# Patient Record
Sex: Male | Born: 1970 | Race: White | Hispanic: No | Marital: Single | Smoking: Never smoker
Health system: Southern US, Community
[De-identification: ages and names within clinical notes are randomized; demographics above are authoritative.]

## PROBLEM LIST (undated history)

## (undated) DIAGNOSIS — F419 Anxiety disorder, unspecified: Secondary | ICD-10-CM

## (undated) DIAGNOSIS — T1490XA Injury, unspecified, initial encounter: Secondary | ICD-10-CM

## (undated) DIAGNOSIS — F319 Bipolar disorder, unspecified: Secondary | ICD-10-CM

## (undated) DIAGNOSIS — F1010 Alcohol abuse, uncomplicated: Secondary | ICD-10-CM

## (undated) DIAGNOSIS — F1410 Cocaine abuse, uncomplicated: Secondary | ICD-10-CM

## (undated) HISTORY — PX: CRANIECTOMY: SHX331

---

## 1998-11-15 ENCOUNTER — Emergency Department (HOSPITAL_COMMUNITY): Admission: EM | Admit: 1998-11-15 | Discharge: 1998-11-15 | Payer: Self-pay | Admitting: Emergency Medicine

## 1998-11-17 ENCOUNTER — Encounter: Payer: Self-pay | Admitting: Emergency Medicine

## 1998-11-17 ENCOUNTER — Emergency Department (HOSPITAL_COMMUNITY): Admission: EM | Admit: 1998-11-17 | Discharge: 1998-11-17 | Payer: Self-pay | Admitting: Emergency Medicine

## 2001-08-20 ENCOUNTER — Emergency Department (HOSPITAL_COMMUNITY): Admission: EM | Admit: 2001-08-20 | Discharge: 2001-08-20 | Payer: Self-pay | Admitting: Emergency Medicine

## 2007-11-14 ENCOUNTER — Emergency Department (HOSPITAL_COMMUNITY): Admission: EM | Admit: 2007-11-14 | Discharge: 2007-11-15 | Payer: Self-pay | Admitting: Emergency Medicine

## 2008-03-08 ENCOUNTER — Emergency Department (HOSPITAL_COMMUNITY): Admission: EM | Admit: 2008-03-08 | Discharge: 2008-03-08 | Payer: Self-pay | Admitting: Emergency Medicine

## 2009-02-11 ENCOUNTER — Emergency Department (HOSPITAL_COMMUNITY): Admission: EM | Admit: 2009-02-11 | Discharge: 2009-02-11 | Payer: Self-pay | Admitting: Emergency Medicine

## 2009-02-14 ENCOUNTER — Emergency Department (HOSPITAL_COMMUNITY): Admission: EM | Admit: 2009-02-14 | Discharge: 2009-02-14 | Payer: Self-pay | Admitting: Emergency Medicine

## 2009-03-19 ENCOUNTER — Emergency Department (HOSPITAL_COMMUNITY): Admission: EM | Admit: 2009-03-19 | Discharge: 2009-03-20 | Payer: Self-pay | Admitting: Emergency Medicine

## 2009-08-26 ENCOUNTER — Emergency Department (HOSPITAL_COMMUNITY): Admission: EM | Admit: 2009-08-26 | Discharge: 2009-08-26 | Payer: Self-pay | Admitting: Emergency Medicine

## 2009-10-02 ENCOUNTER — Emergency Department: Payer: Self-pay | Admitting: Emergency Medicine

## 2010-05-24 ENCOUNTER — Emergency Department (HOSPITAL_COMMUNITY): Admission: EM | Admit: 2010-05-24 | Discharge: 2010-05-24 | Payer: Self-pay | Admitting: Emergency Medicine

## 2010-06-16 ENCOUNTER — Emergency Department (HOSPITAL_COMMUNITY): Admission: EM | Admit: 2010-06-16 | Discharge: 2010-06-16 | Payer: Self-pay | Admitting: Emergency Medicine

## 2010-07-23 ENCOUNTER — Emergency Department (HOSPITAL_COMMUNITY): Admission: EM | Admit: 2010-07-23 | Discharge: 2010-07-23 | Payer: Self-pay | Admitting: Emergency Medicine

## 2010-08-30 ENCOUNTER — Emergency Department (HOSPITAL_COMMUNITY): Admission: EM | Admit: 2010-08-30 | Discharge: 2010-08-31 | Payer: Self-pay | Admitting: Emergency Medicine

## 2010-09-27 ENCOUNTER — Emergency Department (HOSPITAL_COMMUNITY)
Admission: EM | Admit: 2010-09-27 | Discharge: 2010-09-27 | Payer: Self-pay | Source: Home / Self Care | Admitting: Emergency Medicine

## 2010-10-10 ENCOUNTER — Emergency Department (HOSPITAL_COMMUNITY)
Admission: EM | Admit: 2010-10-10 | Discharge: 2010-10-10 | Disposition: A | Discharge status: Other Acute Inpatient Hospital | Payer: Self-pay | Source: Home / Self Care | Admitting: Emergency Medicine

## 2010-11-18 ENCOUNTER — Emergency Department (HOSPITAL_COMMUNITY)
Admission: EM | Admit: 2010-11-18 | Discharge: 2010-11-18 | Payer: Self-pay | Source: Home / Self Care | Admitting: Emergency Medicine

## 2010-11-18 LAB — COMPREHENSIVE METABOLIC PANEL
Alkaline Phosphatase: 91 U/L (ref 39–117)
BUN: 9 mg/dL (ref 6–23)
CO2: 27 mEq/L (ref 19–32)
Calcium: 9 mg/dL (ref 8.4–10.5)
GFR calc non Af Amer: >60 mL/min (ref >60)
Glucose, Bld: 89 mg/dL (ref 70–99)
Potassium: 3.7 mEq/L (ref 3.5–5.1)
Total Protein: 7.3 g/dL (ref 6.0–8.3)

## 2010-11-18 LAB — RAPID URINE DRUG SCREEN, HOSP PERFORMED
Amphetamines: NOT DETECTED
Barbiturates: NOT DETECTED
Benzodiazepines: POSITIVE — AB
Opiates: NOT DETECTED

## 2010-11-18 LAB — DIFFERENTIAL
Basophils Absolute: 0 10*3/uL (ref 0.0–0.1)
Basophils Relative: 0 % (ref 0–1)
Neutro Abs: 3.8 10*3/uL (ref 1.7–7.7)
Neutrophils Relative %: 55 % (ref 43–77)

## 2010-11-18 LAB — CBC
Hemoglobin: 15.5 g/dL (ref 13.0–17.0)
RBC: 4.77 MIL/uL (ref 4.22–5.81)

## 2010-11-18 LAB — ETHANOL: Alcohol, Ethyl (B): <5 mg/dL (ref 0–10)

## 2011-01-01 LAB — CBC
HCT: 45 % (ref 39.0–52.0)
Hemoglobin: 16.4 g/dL (ref 13.0–17.0)
MCV: 92.8 fL (ref 78.0–100.0)
RDW: 14 % (ref 11.5–15.5)
WBC: 6.4 10*3/uL (ref 4.0–10.5)

## 2011-01-01 LAB — COMPREHENSIVE METABOLIC PANEL
ALT: 42 U/L (ref 0–53)
Alkaline Phosphatase: 80 U/L (ref 39–117)
BUN: 8 mg/dL (ref 6–23)
CO2: 22 mEq/L (ref 19–32)
Chloride: 103 mEq/L (ref 96–112)
Glucose, Bld: 117 mg/dL — ABNORMAL HIGH (ref 70–99)
Potassium: 3.5 mEq/L (ref 3.5–5.1)
Sodium: 138 mEq/L (ref 135–145)
Total Bilirubin: 0.9 mg/dL (ref 0.3–1.2)
Total Protein: 7.6 g/dL (ref 6.0–8.3)

## 2011-01-01 LAB — DIFFERENTIAL
Eosinophils Relative: 1 % (ref 0–5)
Lymphocytes Relative: 28 % (ref 12–46)
Lymphs Abs: 1.8 10*3/uL (ref 0.7–4.0)
Monocytes Absolute: 0.6 10*3/uL (ref 0.1–1.0)
Monocytes Relative: 9 % (ref 3–12)

## 2011-01-01 LAB — POCT CARDIAC MARKERS
CKMB, poc: <1 ng/mL — ABNORMAL LOW (ref 1.0–8.0)
Troponin i, poc: <0.05 ng/mL (ref 0.00–0.09)
Troponin i, poc: <0.05 ng/mL (ref 0.00–0.09)

## 2011-01-01 LAB — RAPID URINE DRUG SCREEN, HOSP PERFORMED
Barbiturates: NOT DETECTED
Benzodiazepines: POSITIVE — AB

## 2011-01-02 LAB — BASIC METABOLIC PANEL
BUN: 7 mg/dL (ref 6–23)
CO2: 27 mEq/L (ref 19–32)
Chloride: 95 mEq/L — ABNORMAL LOW (ref 96–112)
GFR calc Af Amer: >60 mL/min (ref >60)
GFR calc non Af Amer: >60 mL/min (ref >60)
Glucose, Bld: 100 mg/dL — ABNORMAL HIGH (ref 70–99)
Glucose, Bld: 109 mg/dL — ABNORMAL HIGH (ref 70–99)
Potassium: 3.8 mEq/L (ref 3.5–5.1)
Sodium: 140 mEq/L (ref 135–145)

## 2011-01-02 LAB — DIFFERENTIAL
Basophils Absolute: 0 10*3/uL (ref 0.0–0.1)
Basophils Relative: 1 % (ref 0–1)
Eosinophils Absolute: 0 10*3/uL (ref 0.0–0.7)
Eosinophils Absolute: 0.1 10*3/uL (ref 0.0–0.7)
Eosinophils Relative: 1 % (ref 0–5)
Eosinophils Relative: 1 % (ref 0–5)
Lymphs Abs: 2.3 10*3/uL (ref 0.7–4.0)
Monocytes Absolute: 0.8 10*3/uL (ref 0.1–1.0)
Monocytes Absolute: 0.9 10*3/uL (ref 0.1–1.0)
Monocytes Relative: 8 % (ref 3–12)
Neutro Abs: 3.5 10*3/uL (ref 1.7–7.7)

## 2011-01-02 LAB — RAPID URINE DRUG SCREEN, HOSP PERFORMED
Barbiturates: NOT DETECTED
Barbiturates: NOT DETECTED
Benzodiazepines: POSITIVE — AB
Cocaine: POSITIVE — AB
Opiates: NOT DETECTED
Opiates: NOT DETECTED

## 2011-01-02 LAB — HEPATIC FUNCTION PANEL
AST: 30 U/L (ref 0–37)
Bilirubin, Direct: 0.1 mg/dL (ref 0.0–0.3)
Indirect Bilirubin: 0.6 mg/dL (ref 0.3–0.9)

## 2011-01-02 LAB — CBC
HCT: 45.7 % (ref 39.0–52.0)
Hemoglobin: 17.1 g/dL — ABNORMAL HIGH (ref 13.0–17.0)
MCH: 32.5 pg (ref 26.0–34.0)
MCHC: 37.2 g/dL — ABNORMAL HIGH (ref 30.0–36.0)
MCV: 95.6 fL (ref 78.0–100.0)
RBC: 5.26 MIL/uL (ref 4.22–5.81)
RDW: 14 % (ref 11.5–15.5)

## 2011-01-02 LAB — POCT CARDIAC MARKERS: Troponin i, poc: <0.05 ng/mL (ref 0.00–0.09)

## 2011-01-24 LAB — POCT CARDIAC MARKERS
CKMB, poc: <1 ng/mL — ABNORMAL LOW (ref 1.0–8.0)
Troponin i, poc: <0.05 ng/mL (ref 0.00–0.09)

## 2011-01-24 LAB — DIFFERENTIAL
Basophils Absolute: 0 10*3/uL (ref 0.0–0.1)
Lymphocytes Relative: 29 % (ref 12–46)
Monocytes Absolute: 0.9 10*3/uL (ref 0.1–1.0)
Neutro Abs: 4.6 10*3/uL (ref 1.7–7.7)

## 2011-01-24 LAB — COMPREHENSIVE METABOLIC PANEL
Albumin: 4.3 g/dL (ref 3.5–5.2)
BUN: 13 mg/dL (ref 6–23)
Chloride: 102 mEq/L (ref 96–112)
Creatinine, Ser: 1.02 mg/dL (ref 0.4–1.5)
Total Bilirubin: 0.9 mg/dL (ref 0.3–1.2)
Total Protein: 7.9 g/dL (ref 6.0–8.3)

## 2011-01-24 LAB — CBC
HCT: 46.8 % (ref 39.0–52.0)
MCHC: 34.4 g/dL (ref 30.0–36.0)
MCV: 94.1 fL (ref 78.0–100.0)
Platelets: 257 10*3/uL (ref 150–400)
RDW: 13.6 % (ref 11.5–15.5)

## 2011-01-30 LAB — CBC
MCHC: 34.4 g/dL (ref 30.0–36.0)
MCV: 95.8 fL (ref 78.0–100.0)
Platelets: 299 10*3/uL (ref 150–400)
RDW: 14.6 % (ref 11.5–15.5)
WBC: 9.1 10*3/uL (ref 4.0–10.5)

## 2011-01-30 LAB — RAPID URINE DRUG SCREEN, HOSP PERFORMED
Amphetamines: NOT DETECTED
Barbiturates: NOT DETECTED
Cocaine: NOT DETECTED
Opiates: NOT DETECTED
Tetrahydrocannabinol: NOT DETECTED

## 2011-01-30 LAB — DIFFERENTIAL
Eosinophils Relative: 1 % (ref 0–5)
Lymphocytes Relative: 32 % (ref 12–46)
Lymphs Abs: 2.9 10*3/uL (ref 0.7–4.0)
Monocytes Absolute: 0.8 10*3/uL (ref 0.1–1.0)

## 2011-01-30 LAB — COMPREHENSIVE METABOLIC PANEL
AST: 25 U/L (ref 0–37)
Albumin: 4.4 g/dL (ref 3.5–5.2)
Calcium: 9.3 mg/dL (ref 8.4–10.5)
Creatinine, Ser: 1.04 mg/dL (ref 0.4–1.5)
GFR calc Af Amer: >60 mL/min (ref >60)
Total Protein: 7.7 g/dL (ref 6.0–8.3)

## 2011-01-30 LAB — URINALYSIS, ROUTINE W REFLEX MICROSCOPIC
Bilirubin Urine: NEGATIVE
Hgb urine dipstick: NEGATIVE
Nitrite: NEGATIVE
Specific Gravity, Urine: 1.012 (ref 1.005–1.030)
pH: 5.5 (ref 5.0–8.0)

## 2011-01-31 LAB — CBC
Hemoglobin: 15.6 g/dL (ref 13.0–17.0)
MCHC: 34.6 g/dL (ref 30.0–36.0)
MCV: 93.5 fL (ref 78.0–100.0)
RDW: 14.8 % (ref 11.5–15.5)

## 2011-01-31 LAB — COMPREHENSIVE METABOLIC PANEL
ALT: 40 U/L (ref 0–53)
Calcium: 8.6 mg/dL (ref 8.4–10.5)
Creatinine, Ser: 1.1 mg/dL (ref 0.4–1.5)
GFR calc non Af Amer: >60 mL/min (ref >60)
Glucose, Bld: 118 mg/dL — ABNORMAL HIGH (ref 70–99)
Sodium: 138 mEq/L (ref 135–145)
Total Protein: 7.2 g/dL (ref 6.0–8.3)

## 2011-01-31 LAB — URINALYSIS, ROUTINE W REFLEX MICROSCOPIC
Glucose, UA: NEGATIVE mg/dL
Leukocytes, UA: NEGATIVE
Specific Gravity, Urine: <1.005 — ABNORMAL LOW (ref 1.005–1.030)
pH: 5 (ref 5.0–8.0)

## 2011-01-31 LAB — DIFFERENTIAL
Lymphocytes Relative: 22 % (ref 12–46)
Lymphs Abs: 2.2 10*3/uL (ref 0.7–4.0)
Monocytes Relative: 7 % (ref 3–12)
Neutro Abs: 7.1 10*3/uL (ref 1.7–7.7)
Neutrophils Relative %: 71 % (ref 43–77)

## 2011-01-31 LAB — URINE MICROSCOPIC-ADD ON

## 2011-02-22 ENCOUNTER — Emergency Department (HOSPITAL_COMMUNITY)
Admission: EM | Admit: 2011-02-22 | Discharge: 2011-02-22 | Disposition: A | Discharge status: Home / Self Care | Payer: Medicaid Other | Attending: Emergency Medicine | Admitting: Emergency Medicine

## 2011-02-22 DIAGNOSIS — W3189XA Contact with other specified machinery, initial encounter: Secondary | ICD-10-CM | POA: Insufficient documentation

## 2011-02-22 DIAGNOSIS — Z79899 Other long term (current) drug therapy: Secondary | ICD-10-CM | POA: Insufficient documentation

## 2011-02-22 DIAGNOSIS — F17200 Nicotine dependence, unspecified, uncomplicated: Secondary | ICD-10-CM | POA: Insufficient documentation

## 2011-02-22 DIAGNOSIS — F1020 Alcohol dependence, uncomplicated: Secondary | ICD-10-CM | POA: Insufficient documentation

## 2011-02-22 DIAGNOSIS — S51809A Unspecified open wound of unspecified forearm, initial encounter: Secondary | ICD-10-CM | POA: Insufficient documentation

## 2011-02-22 DIAGNOSIS — F319 Bipolar disorder, unspecified: Secondary | ICD-10-CM | POA: Insufficient documentation

## 2011-04-14 ENCOUNTER — Emergency Department (HOSPITAL_COMMUNITY): Payer: Medicaid Other

## 2011-04-14 ENCOUNTER — Emergency Department (HOSPITAL_COMMUNITY)
Admission: EM | Admit: 2011-04-14 | Discharge: 2011-04-14 | Disposition: A | Discharge status: Home / Self Care | Payer: Medicaid Other | Attending: Emergency Medicine | Admitting: Emergency Medicine

## 2011-04-14 DIAGNOSIS — R413 Other amnesia: Secondary | ICD-10-CM | POA: Insufficient documentation

## 2011-04-14 DIAGNOSIS — F17200 Nicotine dependence, unspecified, uncomplicated: Secondary | ICD-10-CM | POA: Insufficient documentation

## 2011-04-14 DIAGNOSIS — F1020 Alcohol dependence, uncomplicated: Secondary | ICD-10-CM | POA: Insufficient documentation

## 2011-04-14 DIAGNOSIS — F1911 Other psychoactive substance abuse, in remission: Secondary | ICD-10-CM | POA: Insufficient documentation

## 2011-04-14 DIAGNOSIS — R209 Unspecified disturbances of skin sensation: Secondary | ICD-10-CM | POA: Insufficient documentation

## 2011-04-14 DIAGNOSIS — F319 Bipolar disorder, unspecified: Secondary | ICD-10-CM | POA: Insufficient documentation

## 2011-04-14 DIAGNOSIS — F29 Unspecified psychosis not due to a substance or known physiological condition: Secondary | ICD-10-CM | POA: Insufficient documentation

## 2011-04-14 DIAGNOSIS — Z87820 Personal history of traumatic brain injury: Secondary | ICD-10-CM | POA: Insufficient documentation

## 2011-04-14 DIAGNOSIS — Z91199 Patient's noncompliance with other medical treatment and regimen due to unspecified reason: Secondary | ICD-10-CM | POA: Insufficient documentation

## 2011-04-14 DIAGNOSIS — Z9119 Patient's noncompliance with other medical treatment and regimen: Secondary | ICD-10-CM | POA: Insufficient documentation

## 2011-04-14 LAB — CBC
Hemoglobin: 17.5 g/dL — ABNORMAL HIGH (ref 13.0–17.0)
MCHC: 35.1 g/dL (ref 30.0–36.0)
Platelets: 216 10*3/uL (ref 150–400)

## 2011-04-14 LAB — RAPID URINE DRUG SCREEN, HOSP PERFORMED
Amphetamines: NOT DETECTED
Opiates: NOT DETECTED
Tetrahydrocannabinol: NOT DETECTED

## 2011-04-14 LAB — COMPREHENSIVE METABOLIC PANEL
ALT: 32 U/L (ref 0–53)
AST: 22 U/L (ref 0–37)
Albumin: 4.6 g/dL (ref 3.5–5.2)
Alkaline Phosphatase: 84 U/L (ref 39–117)
GFR calc Af Amer: >60 mL/min (ref >60)
Glucose, Bld: 106 mg/dL — ABNORMAL HIGH (ref 70–99)
Potassium: 4.4 mEq/L (ref 3.5–5.1)
Sodium: 137 mEq/L (ref 135–145)
Total Protein: 8.4 g/dL — ABNORMAL HIGH (ref 6.0–8.3)

## 2011-04-14 LAB — DIFFERENTIAL
Basophils Absolute: 0 10*3/uL (ref 0.0–0.1)
Basophils Relative: 0 % (ref 0–1)
Monocytes Absolute: 1.1 10*3/uL — ABNORMAL HIGH (ref 0.1–1.0)
Neutro Abs: 7.4 10*3/uL (ref 1.7–7.7)
Neutrophils Relative %: 71 % (ref 43–77)

## 2011-04-14 LAB — URINALYSIS, ROUTINE W REFLEX MICROSCOPIC
Glucose, UA: NEGATIVE mg/dL
Hgb urine dipstick: NEGATIVE
Ketones, ur: NEGATIVE mg/dL
Protein, ur: NEGATIVE mg/dL

## 2011-05-09 ENCOUNTER — Emergency Department (HOSPITAL_COMMUNITY)
Admission: EM | Admit: 2011-05-09 | Discharge: 2011-05-09 | Disposition: A | Discharge status: Home / Self Care | Payer: Medicaid Other | Attending: Emergency Medicine | Admitting: Emergency Medicine

## 2011-05-09 ENCOUNTER — Encounter: Payer: Self-pay | Admitting: *Deleted

## 2011-05-09 DIAGNOSIS — IMO0002 Reserved for concepts with insufficient information to code with codable children: Secondary | ICD-10-CM | POA: Insufficient documentation

## 2011-05-09 DIAGNOSIS — L0291 Cutaneous abscess, unspecified: Secondary | ICD-10-CM

## 2011-05-09 HISTORY — DX: Anxiety disorder, unspecified: F41.9

## 2011-05-09 HISTORY — DX: Bipolar disorder, unspecified: F31.9

## 2011-05-09 MED ORDER — OXYCODONE-ACETAMINOPHEN 10-325 MG PO TABS: 1.0000 | ORAL_TABLET | Freq: Four times a day (QID) | ORAL | Status: AC | PRN

## 2011-05-09 MED ORDER — DOXYCYCLINE HYCLATE 100 MG PO TABS
100.0000 mg | ORAL_TABLET | Freq: Once | ORAL | Status: AC
  Administered 2011-05-09: 100 mg via ORAL
  Filled 2011-05-09: qty 1

## 2011-05-09 MED ORDER — DOXYCYCLINE HYCLATE 100 MG PO CAPS: 100.0000 mg | ORAL_CAPSULE | Freq: Two times a day (BID) | ORAL | Status: AC

## 2011-05-09 MED ORDER — LIDOCAINE HCL (PF) 1 % IJ SOLN
INTRAMUSCULAR | Status: AC
  Administered 2011-05-09: 5 mL
  Filled 2011-05-09: qty 5

## 2011-05-09 NOTE — ED Notes (Signed)
Incision present to rt fa from I&D of abscess,.

## 2011-05-09 NOTE — ED Notes (Signed)
Pt agreed to stay to be seen.

## 2011-05-09 NOTE — ED Notes (Signed)
?  sbcess to right arm, swelling noted, pt did have drainage this am when he attempted to "mash it", started about 4 days ago

## 2011-05-09 NOTE — ED Notes (Signed)
Pt has abscess to rt fa. Pt attempted to "mash" pus out this am. Area red and swollen. Pt states he can not stay long because [place] nad noted.

## 2011-05-09 NOTE — ED Provider Notes (Signed)
History     Chief Complaint  Patient presents with  . Wound Infection   Patient is a 40 y.o. male presenting with abscess. The history is provided by the patient.  Abscess  This is a new problem. The current episode started less than one week ago. The onset was gradual. The problem occurs continuously. The problem has been unchanged. The abscess is present on the right arm. The problem is mild. The abscess is characterized by redness, painfulness and swelling. It is unknown what he was exposed to. The abscess first occurred at home. Pertinent negatives include no fever and no vomiting. There were no sick contacts. He has received no recent medical care.    Past Medical History  Diagnosis Date  . Bipolar affective disorder   . Anxiety     Past Surgical History  Procedure Date  . 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 No pertinent family history.  History  Substance Use Topics  . Smoking status: Current Everyday Smoker    Types: Cigars  . Smokeless tobacco: Not on file  . Alcohol Use: No      Review of Systems  Constitutional: Negative for fever and chills.  HENT: Negative for neck pain and neck stiffness.   Respiratory: Negative for chest tightness.   Cardiovascular: Negative for chest pain.  Gastrointestinal: Negative for vomiting.  Musculoskeletal: Negative.   Skin: Positive for wound.  Neurological: Negative for dizziness, weakness, numbness and headaches.  Hematological: Does not bruise/bleed easily.    Physical Exam  BP 141/101  Pulse 77  Temp(Src) 98.4 F (36.9 C) (Oral)  Resp 20  Ht 5\' 11"  (1.803 m)  Wt 267 lb (121.11 kg)  BMI 37.24 kg/m2  SpO2 97%  Physical Exam  Nursing note and vitals reviewed. Constitutional: He appears well-developed and well-nourished. No distress.  HENT:  Head: Normocephalic and atraumatic.  Neck: Normal range of motion. Neck supple.  Cardiovascular: Normal rate, regular rhythm  and normal heart sounds.   Pulmonary/Chest: Effort normal and breath sounds normal. He exhibits no tenderness.  Musculoskeletal: He exhibits no edema and no tenderness.  Lymphadenopathy:    He has no cervical adenopathy.  Neurological: He is alert. He exhibits normal muscle tone. Coordination normal.  Skin: Skin is warm and dry.             ED Course  INCISION AND DRAINAGE Date/Time: 05/09/2011 2:10 PM Performed by: Trisha Mangle, Satoya Feeley L. Authorized by: Benny Lennert Consent: Verbal consent obtained. Written consent not obtained. Risks and benefits: risks, benefits and alternatives were discussed Consent given by: patient Patient understanding: patient states understanding of the procedure being performed Patient consent: the patient's understanding of the procedure matches consent given Procedure consent: procedure consent matches procedure scheduled Site marked: the operative site was marked Patient identity confirmed: verbally with patient Time out: Immediately prior to procedure a "time out" was called to verify the correct patient, procedure, equipment, support staff and site/side marked as required. Type: abscess Body area: upper extremity Location details: right arm Anesthesia: local infiltration Local anesthetic: lidocaine 1% without epinephrine Anesthetic total: 3 ml Patient sedated: no Risk factor: underlying major vessel and coagulopathy Scalpel size: 11 Needle gauge: 22 Incision type: single straight Complexity: complex Drainage: bloody Drainage amount: scant Wound treatment: wound left open and drain placed Packing material: 1/4 in iodoform gauze Patient tolerance: Patient tolerated the procedure well with no immediate complications.  MDM  Patient agrees to return to ED in 2 days for recheck and packing removal.  PAtient is alet, NAD.  Pt does not appear toxic.  Radial pulse is intact.  CR<2 sec.        Beautiful Pensyl L. Trisha Mangle, [place] 05/19/11 1758

## 2011-05-11 ENCOUNTER — Emergency Department (HOSPITAL_COMMUNITY)
Admission: EM | Admit: 2011-05-11 | Discharge: 2011-05-11 | Disposition: A | Discharge status: Home / Self Care | Payer: Medicaid Other | Attending: Emergency Medicine | Admitting: Emergency Medicine

## 2011-05-11 ENCOUNTER — Encounter (HOSPITAL_COMMUNITY): Payer: Self-pay

## 2011-05-11 DIAGNOSIS — F17200 Nicotine dependence, unspecified, uncomplicated: Secondary | ICD-10-CM | POA: Insufficient documentation

## 2011-05-11 DIAGNOSIS — Z4800 Encounter for change or removal of nonsurgical wound dressing: Secondary | ICD-10-CM | POA: Insufficient documentation

## 2011-05-11 DIAGNOSIS — F411 Generalized anxiety disorder: Secondary | ICD-10-CM | POA: Insufficient documentation

## 2011-05-11 DIAGNOSIS — IMO0002 Reserved for concepts with insufficient information to code with codable children: Secondary | ICD-10-CM | POA: Insufficient documentation

## 2011-05-11 DIAGNOSIS — F319 Bipolar disorder, unspecified: Secondary | ICD-10-CM | POA: Insufficient documentation

## 2011-05-11 DIAGNOSIS — L089 Local infection of the skin and subcutaneous tissue, unspecified: Secondary | ICD-10-CM

## 2011-05-11 MED ORDER — OXYCODONE-ACETAMINOPHEN 5-325 MG PO TABS: 1.0000 | ORAL_TABLET | Freq: Four times a day (QID) | ORAL | Status: AC | PRN

## 2011-05-11 NOTE — ED Notes (Signed)
Pt presents for packing removal to right forearm. Pt states packing was placed 05/09/2011.

## 2011-05-11 NOTE — ED Notes (Signed)
Pt is here to have packing removed from rt forearm.  nad noted

## 2011-05-11 NOTE — ED Provider Notes (Signed)
History     Chief Complaint  Patient presents with  . Wound Check   Patient is a 40 y.o. male presenting with wound check. The history is provided by the patient. No language interpreter was used.  Wound Check  Treated in ED: 2 days ago. Previous treatment in the ED includes I&D of abscess. Treatments since wound repair include oral antibiotics and regular soap and water washings. Fever duration: No fever. The redness has improved. The swelling has improved. The pain has improved. He has no difficulty moving the affected extremity or digit.  Patient reports he had an abscess drained 2 days ago in ED and was advised to return in 2 days to have packing removed. Patient was placed on doxycycline to treat abscess. Patient reports abscess and surrounding redness, erythema and associated pain have improved significantly since treatment provided 2 days ago. Denies fever, chills, n/v.   Patient seen at 10:40 AM  Past Medical History  Diagnosis Date  . Bipolar affective disorder   . Anxiety     Past Surgical History  Procedure Date  . 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 No pertinent family history.  History  Substance Use Topics  . Smoking status: Current Everyday Smoker    Types: Cigars  . Smokeless tobacco: Not on file  . Alcohol Use: No      Review of Systems  Constitutional: Negative for fever, chills and fatigue.  HENT: Negative for congestion, sinus pressure and ear discharge.   Eyes: Negative for discharge.  Respiratory: Negative for cough.   Cardiovascular: Negative for chest pain.  Gastrointestinal: Negative for abdominal pain and diarrhea.  Genitourinary: Negative for frequency and hematuria.  Musculoskeletal: Negative for back pain.  Skin: Positive for wound. Negative for rash.  Neurological: Negative for seizures and headaches.  Hematological: Negative.   Psychiatric/Behavioral: Negative for hallucinations.     Physical Exam  BP 130/62  Pulse 75  Temp(Src) 97.5 F (36.4 C) (Oral)  Resp 14  Ht 5\' 11"  (1.803 m)  Wt 268 lb (121.564 kg)  BMI 37.38 kg/m2  SpO2 98%  Physical Exam  Nursing note and vitals reviewed. Constitutional: He is oriented to person, place, and time. He appears well-developed. No distress.  HENT:  Head: Normocephalic.  Eyes: Conjunctivae are normal. No scleral icterus.  Neck: Normal range of motion. Neck supple.  Cardiovascular: Normal rate.   Pulmonary/Chest: Effort normal.  Musculoskeletal: Normal range of motion.  Neurological: He is oriented to person, place, and time.  Skin: Skin is warm and dry.       Healing wound from abscess incision and drainage to right forearm. Packing removed from wound.   Psychiatric: He has a normal mood and affect. His behavior is normal.    ED Course  Procedures I personally performed the services described in this documentation, which was scribed in my presence. The recorded information has been reviewed and considered.  MDM   Chart written by Clarita Crane acting as scribe for Benny Lennert, MD  I personally performed the services described in this documentation, which was scribed in my presence. The recorded information has been reviewed and considered.     Benny Lennert, MD 05/11/11 1051

## 2011-05-18 NOTE — ED Provider Notes (Signed)
History     Chief Complaint  Patient presents with  . Wound Infection   HPI  Past Medical History  Diagnosis Date  . Bipolar affective disorder   . Anxiety     Past Surgical History  Procedure Date  . 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 No pertinent family history.  History  Substance Use Topics  . Smoking status: Current Everyday Smoker    Types: Cigars  . Smokeless tobacco: Not on file  . Alcohol Use: No      Review of Systems  Physical Exam  BP 141/101  Pulse 77  Temp(Src) 98.4 F (36.9 C) (Oral)  Resp 20  Ht 5\' 11"  (1.803 m)  Wt 267 lb (121.11 kg)  BMI 37.24 kg/m2  SpO2 97%  Physical Exam  ED Course  Procedures  MDM Medical screening examination/treatment/procedure(s) were performed by non-physician practitioner and as supervising physician I was immediately available for consultation/collaboration.       Benny Lennert, MD 06/08/11 [phone]

## 2011-06-04 NOTE — ED Provider Notes (Signed)
Medical screening examination/treatment/procedure(s) were performed by non-physician practitioner and as supervising physician I was immediately available for consultation/collaboration.   Alajia Schmelzer L Mohannad Olivero, MD 06/04/11 0135 

## 2011-07-18 LAB — RAPID URINE DRUG SCREEN, HOSP PERFORMED
Amphetamines: NOT DETECTED
Benzodiazepines: POSITIVE — AB
Cocaine: NOT DETECTED
Tetrahydrocannabinol: NOT DETECTED

## 2011-07-18 LAB — CBC
Platelets: 239
RDW: 13.5
WBC: 7.4

## 2011-07-18 LAB — BASIC METABOLIC PANEL
BUN: 14
Calcium: 9.2
Creatinine, Ser: 1.36
GFR calc non Af Amer: 60 — ABNORMAL LOW
Glucose, Bld: 163 — ABNORMAL HIGH

## 2011-07-18 LAB — DIFFERENTIAL
Basophils Absolute: 0
Eosinophils Absolute: 0
Lymphocytes Relative: 22
Lymphs Abs: 1.6
Neutrophils Relative %: 71

## 2011-07-18 LAB — ETHANOL: Alcohol, Ethyl (B): <5

## 2011-08-16 ENCOUNTER — Emergency Department (HOSPITAL_COMMUNITY): Payer: Medicaid Other

## 2011-08-16 ENCOUNTER — Encounter (HOSPITAL_COMMUNITY): Payer: Self-pay | Admitting: Emergency Medicine

## 2011-08-16 ENCOUNTER — Emergency Department (HOSPITAL_COMMUNITY)
Admission: EM | Admit: 2011-08-16 | Discharge: 2011-08-16 | Disposition: A | Discharge status: Home / Self Care | Payer: Medicaid Other | Attending: Emergency Medicine | Admitting: Emergency Medicine

## 2011-08-16 DIAGNOSIS — Z79899 Other long term (current) drug therapy: Secondary | ICD-10-CM | POA: Insufficient documentation

## 2011-08-16 DIAGNOSIS — R079 Chest pain, unspecified: Secondary | ICD-10-CM | POA: Insufficient documentation

## 2011-08-16 DIAGNOSIS — W19XXXA Unspecified fall, initial encounter: Secondary | ICD-10-CM | POA: Insufficient documentation

## 2011-08-16 DIAGNOSIS — F17200 Nicotine dependence, unspecified, uncomplicated: Secondary | ICD-10-CM | POA: Insufficient documentation

## 2011-08-16 DIAGNOSIS — T148XXA Other injury of unspecified body region, initial encounter: Secondary | ICD-10-CM | POA: Insufficient documentation

## 2011-08-16 DIAGNOSIS — R0781 Pleurodynia: Secondary | ICD-10-CM

## 2011-08-16 HISTORY — DX: Injury, unspecified, initial encounter: T14.90XA

## 2011-08-16 MED ORDER — DIAZEPAM 5 MG PO TABS: ORAL_TABLET | ORAL | Status: DC

## 2011-08-16 MED ORDER — DICLOFENAC SODIUM 75 MG PO TBEC: 75.0000 mg | DELAYED_RELEASE_TABLET | Freq: Two times a day (BID) | ORAL | Status: DC

## 2011-08-16 MED ORDER — ACETAMINOPHEN-CODEINE #3 300-30 MG PO TABS: 1.0000 | ORAL_TABLET | Freq: Four times a day (QID) | ORAL | Status: AC | PRN
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
## 2011-08-16 NOTE — ED Notes (Signed)
Pt fell and has L sided rib pain x 3 days ago. Bruising noted to anterior L side ribs. Hurts to breathe. States leg gave out on him. Nad. No chest pain.

## 2011-08-17 NOTE — ED Provider Notes (Signed)
Evaluation and management procedures were performed by the PA/NP under my supervision/collaboration.    Lindalou Soltis D Abbye Lao, MD 08/17/11 2121 

## 2011-08-18 ENCOUNTER — Emergency Department (HOSPITAL_COMMUNITY)
Admission: EM | Admit: 2011-08-18 | Discharge: 2011-08-18 | Disposition: A | Discharge status: Home / Self Care | Payer: No Typology Code available for payment source | Attending: Emergency Medicine | Admitting: Emergency Medicine

## 2011-08-18 ENCOUNTER — Encounter (HOSPITAL_COMMUNITY): Payer: Self-pay | Admitting: *Deleted

## 2011-08-18 ENCOUNTER — Emergency Department (HOSPITAL_COMMUNITY): Payer: No Typology Code available for payment source

## 2011-08-18 DIAGNOSIS — R079 Chest pain, unspecified: Secondary | ICD-10-CM | POA: Insufficient documentation

## 2011-08-18 DIAGNOSIS — F411 Generalized anxiety disorder: Secondary | ICD-10-CM | POA: Insufficient documentation

## 2011-08-18 DIAGNOSIS — F319 Bipolar disorder, unspecified: Secondary | ICD-10-CM | POA: Insufficient documentation

## 2011-08-18 DIAGNOSIS — T07XXXA Unspecified multiple injuries, initial encounter: Secondary | ICD-10-CM

## 2011-08-18 DIAGNOSIS — S40019A Contusion of unspecified shoulder, initial encounter: Secondary | ICD-10-CM | POA: Insufficient documentation

## 2011-08-18 DIAGNOSIS — M545 Low back pain, unspecified: Secondary | ICD-10-CM | POA: Insufficient documentation

## 2011-08-18 DIAGNOSIS — IMO0002 Reserved for concepts with insufficient information to code with codable children: Secondary | ICD-10-CM | POA: Insufficient documentation

## 2011-08-18 DIAGNOSIS — F17200 Nicotine dependence, unspecified, uncomplicated: Secondary | ICD-10-CM | POA: Insufficient documentation

## 2011-08-18 DIAGNOSIS — Y92410 Unspecified street and highway as the place of occurrence of the external cause: Secondary | ICD-10-CM | POA: Insufficient documentation

## 2011-08-18 MED ORDER — OXYCODONE-ACETAMINOPHEN 5-325 MG PO TABS: 1.0000 | ORAL_TABLET | ORAL | Status: AC | PRN

## 2011-08-18 MED ORDER — OXYCODONE-ACETAMINOPHEN 5-325 MG PO TABS
1.0000 | ORAL_TABLET | Freq: Once | ORAL | Status: AC
  Administered 2011-08-18: 1 via ORAL

## 2011-08-18 MED ORDER — OXYCODONE-ACETAMINOPHEN 5-325 MG PO TABS
ORAL_TABLET | ORAL | Status: AC
  Filled 2011-08-18: qty 1
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
## 2011-08-18 NOTE — ED Provider Notes (Signed)
Medical screening examination/treatment/procedure(s) were conducted as a shared visit with non-physician practitioner(s) and myself.  I personally evaluated the patient during the encounter  Flint Melter, MD 08/18/11 2321

## 2011-08-18 NOTE — ED Notes (Signed)
Pt a/ox4. Resp even and unlabored. NAD at this time. D/C instructions and rx x 1 reviewed with pt. Pt verbalized understanding. Pt ambulated to lobby with steady gate. Friend with pt to transport pt home.

## 2011-08-21 ENCOUNTER — Encounter (HOSPITAL_COMMUNITY): Payer: Self-pay | Admitting: Emergency Medicine

## 2011-08-21 ENCOUNTER — Emergency Department (HOSPITAL_COMMUNITY)
Admission: EM | Admit: 2011-08-21 | Discharge: 2011-08-21 | Disposition: A | Discharge status: Home / Self Care | Payer: No Typology Code available for payment source | Attending: Emergency Medicine | Admitting: Emergency Medicine

## 2011-08-21 DIAGNOSIS — R0781 Pleurodynia: Secondary | ICD-10-CM

## 2011-08-21 DIAGNOSIS — R079 Chest pain, unspecified: Secondary | ICD-10-CM | POA: Insufficient documentation

## 2011-08-21 DIAGNOSIS — M25519 Pain in unspecified shoulder: Secondary | ICD-10-CM

## 2011-08-21 DIAGNOSIS — M25619 Stiffness of unspecified shoulder, not elsewhere classified: Secondary | ICD-10-CM | POA: Insufficient documentation

## 2011-08-21 MED ORDER — OXYCODONE-ACETAMINOPHEN 5-325 MG PO TABS: 1.0000 | ORAL_TABLET | ORAL | Status: AC | PRN
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
## 2011-08-24 NOTE — ED Provider Notes (Signed)
Medical screening examination/treatment/procedure(s) were performed by non-physician practitioner and as supervising physician I was immediately available for consultation/collaboration. Devoria Albe, MD, Armando Gang   Ward Givens, MD 08/24/11 [phone]

## 2011-09-06 ENCOUNTER — Emergency Department (HOSPITAL_COMMUNITY)
Admission: EM | Admit: 2011-09-06 | Discharge: 2011-09-06 | Disposition: A | Discharge status: Rehabilitation Facility | Payer: Medicaid Other | Attending: Emergency Medicine | Admitting: Emergency Medicine

## 2011-09-06 ENCOUNTER — Emergency Department (HOSPITAL_COMMUNITY): Payer: Medicaid Other

## 2011-09-06 ENCOUNTER — Encounter (HOSPITAL_COMMUNITY): Payer: Self-pay | Admitting: Emergency Medicine

## 2011-09-06 DIAGNOSIS — F319 Bipolar disorder, unspecified: Secondary | ICD-10-CM | POA: Insufficient documentation

## 2011-09-06 DIAGNOSIS — R059 Cough, unspecified: Secondary | ICD-10-CM | POA: Insufficient documentation

## 2011-09-06 DIAGNOSIS — Z9889 Other specified postprocedural states: Secondary | ICD-10-CM | POA: Insufficient documentation

## 2011-09-06 DIAGNOSIS — F1910 Other psychoactive substance abuse, uncomplicated: Secondary | ICD-10-CM

## 2011-09-06 DIAGNOSIS — F411 Generalized anxiety disorder: Secondary | ICD-10-CM | POA: Insufficient documentation

## 2011-09-06 DIAGNOSIS — R05 Cough: Secondary | ICD-10-CM | POA: Insufficient documentation

## 2011-09-06 DIAGNOSIS — F17200 Nicotine dependence, unspecified, uncomplicated: Secondary | ICD-10-CM | POA: Insufficient documentation

## 2011-09-06 DIAGNOSIS — F1410 Cocaine abuse, uncomplicated: Secondary | ICD-10-CM | POA: Insufficient documentation

## 2011-09-06 DIAGNOSIS — F1010 Alcohol abuse, uncomplicated: Secondary | ICD-10-CM | POA: Insufficient documentation

## 2011-09-06 DIAGNOSIS — R071 Chest pain on breathing: Secondary | ICD-10-CM | POA: Insufficient documentation

## 2011-09-06 LAB — COMPREHENSIVE METABOLIC PANEL
BUN: 10 mg/dL (ref 6–23)
CO2: 25 mEq/L (ref 19–32)
Calcium: 9 mg/dL (ref 8.4–10.5)
Chloride: 98 mEq/L (ref 96–112)
Creatinine, Ser: 1.04 mg/dL (ref 0.50–1.35)
GFR calc Af Amer: >90 mL/min (ref >90)
GFR calc non Af Amer: 90 mL/min — ABNORMAL LOW (ref >90)
Total Bilirubin: 0.4 mg/dL (ref 0.3–1.2)

## 2011-09-06 LAB — CBC
HCT: 45.9 % (ref 39.0–52.0)
MCH: 32.5 pg (ref 26.0–34.0)
MCV: 92.5 fL (ref 78.0–100.0)
Platelets: 258 10*3/uL (ref 150–400)
RBC: 4.96 MIL/uL (ref 4.22–5.81)
WBC: 7.3 10*3/uL (ref 4.0–10.5)

## 2011-09-06 LAB — RAPID URINE DRUG SCREEN, HOSP PERFORMED
Cocaine: POSITIVE — AB
Opiates: NOT DETECTED
Tetrahydrocannabinol: NOT DETECTED

## 2011-09-06 LAB — ETHANOL: Alcohol, Ethyl (B): <11 mg/dL (ref 0–11)

## 2011-09-06 MED ORDER — IBUPROFEN 600 MG PO TABS
600.0000 mg | ORAL_TABLET | Freq: Three times a day (TID) | ORAL | Status: DC | PRN
  Filled 2011-09-06: qty 1

## 2011-09-06 MED ORDER — ALBUTEROL SULFATE HFA 108 (90 BASE) MCG/ACT IN AERS
2.0000 | INHALATION_SPRAY | RESPIRATORY_TRACT | Status: DC
  Administered 2011-09-06 (×4): 2 via RESPIRATORY_TRACT
  Filled 2011-09-06: qty 6.7

## 2011-09-06 MED ORDER — NICOTINE 21 MG/24HR TD PT24
21.0000 mg | MEDICATED_PATCH | Freq: Every day | TRANSDERMAL | Status: DC
  Filled 2011-09-06: qty 1

## 2011-09-06 MED ORDER — LORAZEPAM 1 MG PO TABS
1.0000 mg | ORAL_TABLET | Freq: Three times a day (TID) | ORAL | Status: DC | PRN
  Administered 2011-09-06: 1 mg via ORAL
  Filled 2011-09-06: qty 1

## 2011-09-06 MED ORDER — ONDANSETRON HCL 4 MG PO TABS: 4.0000 mg | ORAL_TABLET | Freq: Three times a day (TID) | ORAL | Status: DC | PRN

## 2011-09-06 MED ORDER — ALUM & MAG HYDROXIDE-SIMETH 200-200-20 MG/5ML PO SUSP: 30.0000 mL | ORAL | Status: DC | PRN

## 2011-09-06 MED ORDER — ZOLPIDEM TARTRATE 5 MG PO TABS: 5.0000 mg | ORAL_TABLET | Freq: Every evening | ORAL | Status: DC | PRN

## 2011-09-06 MED ORDER — ACETAMINOPHEN 325 MG PO TABS: 650.0000 mg | ORAL_TABLET | ORAL | Status: DC | PRN

## 2011-09-06 NOTE — ED Provider Notes (Signed)
Pt has been accepted at [facility].  His father is going to take him there.  Nicholes Stairs, MD 09/06/11 2158

## 2011-09-06 NOTE — ED Notes (Signed)
Pt stated that he was not hungry and asked for meal tray to be discarded.  Pt was provided a cup of ice and Coke.
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
## 2011-09-06 NOTE — ED Notes (Signed)
Pt has been sleeping throughout the day...waiting to move to the BH-ED when bed is available.Marland KitchenMarland Kitchen

## 2011-09-06 NOTE — ED Notes (Signed)
Blood drawn from Left AC.  Specimen obtained via butterfly. Full rainbow obtained top obtained. Pt tolerated well.
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
## 2011-09-06 NOTE — ED Notes (Signed)
Patient has one bag locked in activity room

## 2011-09-06 NOTE — ED Notes (Signed)
QMV:[id number]<NG> Expected date:09/06/11<BR> Expected time: 2:35 AM<BR> Means of arrival:Ambulance<BR> Comments:<BR> Etoh, crack binge for three weeks

## 2011-09-06 NOTE — ED Notes (Addendum)
Pt to ED with a 3 week alcohol and cocaine binge. Pt denies SI/HI. Pt resting on stretcher with eyes close.

## 2011-09-06 NOTE — BH Assessment (Signed)
Assessment Note   Gary Stanley is an 40 y.o. male. PATIENT PRESENTS WITH C/O OF SUBSTANCE ABUSE AND IS REQUESTING VOLUNTARY INPATIENT ETOH DETOX TREATMENT. PT REPORTS BINGE ETOH AND CRACK USE FOR THE PAST COUPLE WEEKS ALMOST DAILY. PT REPORTS DRINKING AN 18 PK> ETOH (BEER) DAILY AND 1-2 CRACK ROCKS 3 DAYS PER WEEK.  PT REPORTS "I USUALLY ONLY USE CRACK WHEN I DRINK ETOH" PT REPORTS  THAT [place] AND [place] NOT MUCH TOO DO. PT REPORTS BEING "BORED" AND REPORTS SELF MEDICATING WITH ETOH AND CRACK WHEN HE HAS NOTHING TO DO. PT REPORTS THAT HE HAS NO DRIVER'S LICENSE AND WOULD NOT SAY WHY. PT REPORTS FEELING SAD ABOUT HIS NEPHEW WHO DIED OF A DRUG OVERDOSE ON 05-Apr-2010. PT DENIES SI,HI, AND NO AVH REPORTED. CONSULTED WITH EDP DR. Clarene Duke WHO IS AGREEABLE WITH PT BEING REFERRED TO OTHER FACILITY FOR INPATIENT DETOX TREATMENT.  Axis I: Alcohol Dependence Axis II: Deferred Axis III:  Past Medical History  Diagnosis Date  . Bipolar affective disorder   . Anxiety   . Trauma to brain   Axis IV: other psychosocial or environmental problems and problems related to social environment Axis V: 31-40 impairment in reality testing  Past Medical History:  Past Medical History  Diagnosis Date  . Bipolar affective disorder   . Anxiety   . Trauma to brain    Past Surgical History  Procedure Date  . Craniectomy     pt had surgery to remove blood clot from brain     Family History: History reviewed. No pertinent family history.  Social History:  reports that he has been smoking Cigars and Cigarettes.  He does not have any smokeless tobacco history on file. He reports that he drinks about 10.8 ounces of alcohol per week. He reports that he uses illicit drugs ("Crack" cocaine) about 3 times per week.  Allergies:  Allergies  Allergen Reactions  . Hydrocodone Itching and Nausea Only    Home Medications:  Medications Prior to Admission  Medication Dose Route Frequency Provider Last  Rate Last Dose  . acetaminophen (TYLENOL) tablet 650 mg  650 mg Oral Q4H PRN Olivia Mackie, MD      . albuterol (PROVENTIL HFA;VENTOLIN HFA) 108 (90 BASE) MCG/ACT inhaler 2 puff  2 puff Inhalation Q4H Olivia Mackie, MD   2 puff at 09/06/11 1156  . alum & mag hydroxide-simeth (MAALOX/MYLANTA) 200-200-20 MG/5ML suspension 30 mL  30 mL Oral PRN Olivia Mackie, MD      . ibuprofen (ADVIL,MOTRIN) tablet 600 mg  600 mg Oral Q8H PRN Olivia Mackie, MD      . LORazepam (ATIVAN) tablet 1 mg  1 mg Oral Q8H PRN Olivia Mackie, MD   1 mg at 09/06/11 1341  . nicotine (NICODERM CQ - dosed in mg/24 hours) patch 21 mg  21 mg Transdermal Daily Olivia Mackie, MD      . ondansetron [facility]) tablet 4 mg  4 mg Oral Q8H PRN Olivia Mackie, MD      . zolpidem [facility]) tablet 5 mg  5 mg Oral QHS PRN Olivia Mackie, MD       Medications Prior to Admission  Medication Sig Dispense Refill  . alprazolam (XANAX) 2 MG tablet Take 2 mg by mouth 4 (four) times daily as needed. For anxiety      . diazepam (VALIUM) 5 MG tablet Take 5 mg by mouth 3 (three) times daily  as needed. for spasm         OB/GYN Status:  No LMP for male patient.  General Assessment Data Assessment Number: 1  Living Arrangements: Parent Can pt return to current living arrangement?: Yes Admission Status: Voluntary Is patient capable of signing voluntary admission?: Yes Transfer from: Acute Hospital Referral Source: Other ([facility])  Risk to self Suicidal Ideation: No-Not Currently/Within Last 6 Months Suicidal Intent: No-Not Currently/Within Last 6 Months Is patient at risk for suicide?: No Suicidal Plan?: No-Not Currently/Within Last 6 Months Access to Means: No What has been your use of drugs/alcohol within the last 12 months?:  (Etoh and Crack) Other Self Harm Risks: na Triggers for Past Attempts: None known Intentional Self Injurious Behavior: None Factors that decrease suicide risk: Absense of psychosis Family Suicide History: No Recent  stressful life event(s): Loss (Comment) (Nephew that he was close to died of o/d on 03-27-10) Persecutory voices/beliefs?: No Depression: No Substance abuse history and/or treatment for substance abuse?: Yes Suicide prevention information given to non-admitted patients: Not applicable  Risk to Others Homicidal Ideation: No-Not Currently/Within Last 6 Months Thoughts of Harm to Others: No-Not Currently Present/Within Last 6 Months Current Homicidal Intent: No-Not Currently/Within Last 6 Months Current Homicidal Plan: No-Not Currently/Within Last 6 Months Access to Homicidal Means: No Identified Victim: na History of harm to others?: No Assessment of Violence: None Noted Violent Behavior Description:  (none reported) Does patient have access to weapons?: No Criminal Charges Pending?: No Does patient have a court date: No  Mental Status Report Appear/Hygiene: Disheveled Eye Contact: Fair Motor Activity: Agitation;Restlessness;Other (Comment) (verbally agressive, rude to staff, demanding) Speech: Other (Comment) (Appropriate) Level of Consciousness: Alert Mood: Anxious Affect: Anxious Anxiety Level: Moderate Thought Processes:  (continues to request narcotics for pain) Judgement: Impaired Orientation: Person;Place;Time;Situation Obsessive Compulsive Thoughts/Behaviors: None  Cognitive Functioning Concentration: Decreased Memory: Recent Intact IQ: Average Insight: Poor Impulse Control: Poor Appetite: Good Sleep: No Change Vegetative Symptoms: None  Prior Inpatient/Outpatient Therapy Prior Therapy: Inpatient Prior Therapy Dates:  (multiple inpatient sa admission in 2011 and 2012) Prior Therapy Facilty/Provider(s):  ([facility]) Reason for Treatment:  (Detox and Residential Treatment)  ADL Screening (condition at time of admission) Patient's cognitive ability adequate to safely complete daily activities?: Yes Patient able to express need for  assistance with ADLs?: Yes Independently performs ADLs?: Yes Weakness of Legs: None Weakness of Arms/Hands: None  Home Assistive Devices/Equipment Home Assistive Devices/Equipment: None    Abuse/Neglect Assessment (Assessment to be complete while patient is alone) Physical Abuse: Denies Verbal Abuse: Denies Sexual Abuse: Denies Exploitation of patient/patient's resources: Denies Self-Neglect: Denies Values / Beliefs Cultural Requests During Hospitalization: None Spiritual Requests During Hospitalization: None   Advance Directives (For Healthcare) Advance Directive: Patient does not have advance directive Nutrition Screen Unintentional weight loss greater than 10lbs within the last month: No Dysphagia: No Home Tube Feeding or Total Parenteral Nutrition (TPN): No Patient appears severely malnourished: No Pregnant or Lactating: No  Additional Information 1:1 In Past 12 Months?: No CIRT Risk: No Elopement Risk: No Does patient have medical clearance?: Yes     Disposition:  Disposition Disposition of Patient: Referred to (Referred to [facility] and [facility] for Inpatient Treatment) Patient referred to: Other (Comment) [facility] )  On Site Evaluation by:   Reviewed with Physician:     Bjorn Pippin 09/06/2011 2:29 PM

## 2011-09-30 ENCOUNTER — Emergency Department (HOSPITAL_COMMUNITY)
Admission: EM | Admit: 2011-09-30 | Discharge: 2011-09-30 | Disposition: A | Discharge status: Home / Self Care | Payer: No Typology Code available for payment source

## 2011-11-04 ENCOUNTER — Emergency Department (HOSPITAL_COMMUNITY)
Admission: EM | Admit: 2011-11-04 | Discharge: 2011-11-05 | Discharge status: Against Medical Advice | Payer: Medicaid Other | Attending: Emergency Medicine | Admitting: Emergency Medicine

## 2011-11-04 ENCOUNTER — Encounter (HOSPITAL_COMMUNITY): Payer: Self-pay

## 2011-11-04 DIAGNOSIS — G479 Sleep disorder, unspecified: Secondary | ICD-10-CM | POA: Insufficient documentation

## 2011-11-04 DIAGNOSIS — R450 Nervousness: Secondary | ICD-10-CM | POA: Insufficient documentation

## 2011-11-04 DIAGNOSIS — F411 Generalized anxiety disorder: Secondary | ICD-10-CM | POA: Insufficient documentation

## 2011-11-04 DIAGNOSIS — F1410 Cocaine abuse, uncomplicated: Secondary | ICD-10-CM | POA: Insufficient documentation

## 2011-11-04 DIAGNOSIS — Z5329 Procedure and treatment not carried out because of patient's decision for other reasons: Secondary | ICD-10-CM

## 2011-11-04 DIAGNOSIS — F1010 Alcohol abuse, uncomplicated: Secondary | ICD-10-CM | POA: Insufficient documentation

## 2011-11-04 DIAGNOSIS — F319 Bipolar disorder, unspecified: Secondary | ICD-10-CM | POA: Insufficient documentation

## 2011-11-04 DIAGNOSIS — F17200 Nicotine dependence, unspecified, uncomplicated: Secondary | ICD-10-CM | POA: Insufficient documentation

## 2011-11-04 NOTE — ED Notes (Signed)
MD at bedside. 

## 2011-11-04 NOTE — ED Notes (Signed)
Pt states he has been drinking for 4 days, then finished half gallon of vodka this evening and is now feeling very anxious and nervous.  Pt also admits to using cocaine last night.

## 2011-11-05 ENCOUNTER — Inpatient Hospital Stay (HOSPITAL_COMMUNITY): Admission: AD | Admit: 2011-11-05 | Payer: Medicaid Other | Source: Ambulatory Visit | Admitting: Psychiatry

## 2011-11-05 LAB — RAPID URINE DRUG SCREEN, HOSP PERFORMED
Amphetamines: NOT DETECTED
Barbiturates: NOT DETECTED
Benzodiazepines: POSITIVE — AB
Cocaine: POSITIVE — AB
Tetrahydrocannabinol: NOT DETECTED

## 2011-11-05 LAB — URINALYSIS, ROUTINE W REFLEX MICROSCOPIC
Glucose, UA: NEGATIVE mg/dL
Leukocytes, UA: NEGATIVE
Protein, ur: NEGATIVE mg/dL
Specific Gravity, Urine: 1.015 (ref 1.005–1.030)

## 2011-11-05 LAB — CBC
HCT: 50 % (ref 39.0–52.0)
MCHC: 36.2 g/dL — ABNORMAL HIGH (ref 30.0–36.0)
Platelets: 279 10*3/uL (ref 150–400)
RDW: 13.8 % (ref 11.5–15.5)
WBC: 8 10*3/uL (ref 4.0–10.5)

## 2011-11-05 LAB — COMPREHENSIVE METABOLIC PANEL
ALT: 66 U/L — ABNORMAL HIGH (ref 0–53)
AST: 42 U/L — ABNORMAL HIGH (ref 0–37)
Albumin: 3.8 g/dL (ref 3.5–5.2)
CO2: 24 mEq/L (ref 19–32)
Calcium: 9.1 mg/dL (ref 8.4–10.5)
Creatinine, Ser: 1.12 mg/dL (ref 0.50–1.35)
GFR calc non Af Amer: 82 mL/min — ABNORMAL LOW (ref >90)
Sodium: 134 mEq/L — ABNORMAL LOW (ref 135–145)
Total Protein: 7.8 g/dL (ref 6.0–8.3)

## 2011-11-05 MED ORDER — DIPHENOXYLATE-ATROPINE 2.5-0.025 MG PO TABS
ORAL_TABLET | ORAL | Status: AC
  Administered 2011-11-05: 1
  Filled 2011-11-05: qty 1

## 2011-11-05 MED ORDER — LORAZEPAM 2 MG/ML IJ SOLN
1.0000 mg | Freq: Once | INTRAMUSCULAR | Status: AC
  Administered 2011-11-05: 1 mg via INTRAVENOUS
  Filled 2011-11-05: qty 1

## 2011-11-05 MED ORDER — ONDANSETRON HCL 4 MG PO TABS: 4.0000 mg | ORAL_TABLET | Freq: Three times a day (TID) | ORAL | Status: DC | PRN

## 2011-11-05 MED ORDER — SODIUM CHLORIDE 0.9 % IV BOLUS (SEPSIS)
1000.0000 mL | Freq: Once | INTRAVENOUS | Status: AC
  Administered 2011-11-05: 1000 mL via INTRAVENOUS

## 2011-11-05 MED ORDER — CHLORDIAZEPOXIDE HCL 25 MG PO CAPS
25.0000 mg | ORAL_CAPSULE | Freq: Once | ORAL | Status: AC
  Administered 2011-11-05: 25 mg via ORAL
  Filled 2011-11-05: qty 1

## 2011-11-05 MED ORDER — LORAZEPAM 1 MG PO TABS
1.0000 mg | ORAL_TABLET | ORAL | Status: DC | PRN
  Administered 2011-11-05 (×2): 1 mg via ORAL
  Filled 2011-11-05 (×2): qty 1

## 2011-11-05 MED ORDER — ALPRAZOLAM 0.5 MG PO TABS
1.0000 mg | ORAL_TABLET | Freq: Once | ORAL | Status: AC
  Administered 2011-11-05: 1 mg via ORAL
  Filled 2011-11-05: qty 2

## 2011-11-05 NOTE — ED Notes (Signed)
Says he is feeling anxious, med given, and med for  diarrhea

## 2011-11-05 NOTE — ED Notes (Signed)
Alice myer called from [facility] and said pt had been accepted. Samson Frederic will come up to do paperwork

## 2011-11-05 NOTE — ED Notes (Signed)
Breakfast tray given. Pt calm, cooperative.  Requesting IV be taken out.

## 2011-11-05 NOTE — ED Notes (Signed)
Pt reports no relief of anxiety from meds.  Pt shows no s/s of withdrawal.  Pt calm, given coke with ice per request.  nad noted.

## 2011-11-05 NOTE — ED Notes (Signed)
Pt requesting something for anxiety.  States ativan did not help.  edp notified.

## 2011-11-05 NOTE — ED Notes (Signed)
[facility] , Gary Stanley in to see pt. Pt says he has been having diarrhea x3 this am.

## 2011-11-05 NOTE — ED Notes (Signed)
Pt resting in bed with eyes closed, breathing even and unlabored. Awaiting review by mental health counselor this morning.

## 2011-11-05 NOTE — ED Notes (Signed)
Pt ate most of meal tray.

## 2011-11-05 NOTE — BH Assessment (Signed)
Assessment Note   Gary Stanley is an 41 y.o. male. The patient came to the ED 11/04/11 seeking detox from alcohol and cocaine. He has maintained sobriety/being clean for over 6 months. Wednesday he came home to find his girlfriend with another man. He then began to binge on beer;liquor; and cocaine. He was drinking up to 18 beers daily. He did 1 line of powder on Friday. Sunday he  Drank 1/2 gallon of vodka. He usually takes Xanax and Valium by prescription, which he denies abusing. He states that he takes these medications as the result of a blood clot in 1996, which resulted in anxiety. The Valium is for leg spasms, he says are the result of the anxiety. Carroll Sage MD is currently treating him for this. He has been drinking since age 86. He has been in detox 4 times. He states that he has been a bing drinker. He has been sober for over 6 months. His last drink was Sunday the 13  th. He first used cocaine at age 28.  He uses only occasionally over the years. His recent use was 1 line, last Friday. He denies any SI or HI. He is not psychotic. He is anxious, sweating, restless. He says that noises are bothering him.  Axis I: Alcohol Dependence; Cocaine Abuse;  Axis II: Deferred Axis III:  Past Medical History  Diagnosis Date  . Bipolar affective disorder   . Anxiety   . Trauma to brain   Axis IV: economic problems, housing problems and problems with primary support group Axis V: 31-40 impairment in reality testing  Past Medical History:  Past Medical History  Diagnosis Date  . Bipolar affective disorder   . Anxiety   . Trauma to brain    Past Surgical History  Procedure Date  . Craniectomy     pt had surgery to remove blood clot from brain     Family History: History reviewed. No pertinent family history.  Social History:  reports that he has been smoking Cigars and Cigarettes.  He does not have any smokeless tobacco history on file. He reports that he drinks about 10.8 ounces  of alcohol per week. He reports that he uses illicit drugs ("Crack" cocaine and Cocaine) about 3 times per week.  Additional Social History:    Allergies:  Allergies  Allergen Reactions  . Hydrocodone Itching and Nausea Only    Home Medications:  Medications Prior to Admission  Medication Dose Route Frequency Provider Last Rate Last Dose  . chlordiazePOXIDE (LIBRIUM) capsule 25 mg  25 mg Oral Once [employer]. Colon Branch, MD   25 mg at 11/05/11 0742  . LORazepam (ATIVAN) injection 1 mg  1 mg Intravenous Once Gerhard Munch, MD   1 mg at 11/05/11 0015  . LORazepam (ATIVAN) injection 1 mg  1 mg Intravenous Once Gerhard Munch, MD   1 mg at 11/05/11 0251  . LORazepam (ATIVAN) tablet 1 mg  1 mg Oral Q1H PRN Gerhard Munch, MD   1 mg at 11/05/11 0846  . ondansetron (ZOFRAN) tablet 4 mg  4 mg Oral Q8H PRN Gerhard Munch, MD      . sodium chloride 0.9 % bolus 1,000 mL  1,000 mL Intravenous Once Gerhard Munch, MD   1,000 mL at 11/05/11 0013   Medications Prior to Admission  Medication Sig Dispense Refill  . alprazolam (XANAX) 2 MG tablet Take 2 mg by mouth 4 (four) times daily as needed. For anxiety      .  diazepam (VALIUM) 5 MG tablet Take 10 mg by mouth 4 (four) times daily. for spasm        OB/GYN Status:  No LMP for male patient.  General Assessment Data Location of Assessment: [facility] Assessment: Yes Living Arrangements: Parent Can pt return to current living arrangement?: Yes Admission Status: Voluntary Is patient capable of signing voluntary admission?: Yes Transfer from: Acute Hospital Referral Source: MD  Education Status Is patient currently in school?: No  Risk to self Suicidal Ideation: No Suicidal Intent: No Is patient at risk for suicide?: No Suicidal Plan?: No Access to Means: No What has been your use of drugs/alcohol within the last 12 months?: alcohol;cocaine Previous Attempts/Gestures: No How many times?: 0  Other Self Harm Risks: denies Triggers for Past  Attempts: None known Intentional Self Injurious Behavior: None Factors that decrease suicide risk: Positive social support Family Suicide History: No Recent stressful life event(s): Loss (Comment) (found girlfriend with another man) Persecutory voices/beliefs?: No Depression: Yes Depression Symptoms: Insomnia;Tearfulness;Loss of interest in usual pleasures;Feeling angry/irritable Substance abuse history and/or treatment for substance abuse?: Yes Suicide prevention information given to non-admitted patients: Not applicable  Risk to Others Homicidal Ideation: No Thoughts of Harm to Others: No Current Homicidal Intent: No Current Homicidal Plan: No Access to Homicidal Means: No Identified Victim: denies History of harm to others?: No Assessment of Violence: None Noted Violent Behavior Description: na Does patient have access to weapons?: No Criminal Charges Pending?: No Does patient have a court date: No  Psychosis Hallucinations: None noted Delusions: None noted  Mental Status Report Appear/Hygiene: Improved Eye Contact: Fair Motor Activity: Restlessness Speech: Logical/coherent Level of Consciousness: Alert Mood: Anxious;Depressed Affect: Anxious;Depressed Anxiety Level: Moderate Thought Processes: Coherent;Relevant Judgement: Unimpaired Orientation: Person;Place;Time;Situation Obsessive Compulsive Thoughts/Behaviors: None  Cognitive Functioning Concentration: Normal Memory: Recent Intact;Remote Intact IQ: Average Insight: Fair Impulse Control: Poor Appetite: Fair Weight Loss: 0  Weight Gain: 0  Sleep: Decreased Total Hours of Sleep: 3  Vegetative Symptoms: None Vegetative Symptoms: None  Prior Inpatient Therapy Prior Inpatient Therapy: Yes Prior Therapy Dates: 2012 Prior Therapy Facilty/Provider(s): [facility] Reason for Treatment: detox;rehab  Prior Outpatient Therapy Prior Outpatient Therapy: No Prior Therapy Dates: denies Prior  Therapy Facilty/Provider(s): na Reason for Treatment: na            Values / Beliefs Cultural Requests During Hospitalization: None Spiritual Requests During Hospitalization: None        Additional Information 1:1 In Past 12 Months?: No CIRT Risk: No Elopement Risk: No Does patient have medical clearance?: Yes     Disposition:Patient referred to [facility] and to [facility]. Dr. Colon Branch agrees with disposition. Disposition Disposition of Patient: Inpatient treatment program Patient referred to: Other (Comment) [facility])  On Site Evaluation by:   Reviewed with Physician:     Jearld Pies 11/05/2011 12:10 PM

## 2011-11-05 NOTE — ED Notes (Signed)
Eating a meal 

## 2011-11-05 NOTE — ED Notes (Signed)
Dr Adriana Simas spoke with pt, Encouraged to stay,  Pt had talked about leaving .  Says he feels anxious. Med was given.  Visitor at bedside.

## 2011-11-05 NOTE — ED Provider Notes (Signed)
History     CSN: [id number]  Arrival date & time 11/04/11  2303   First MD Initiated Contact with Patient 11/04/11 2344      Chief Complaint  Patient presents with  . Panic Attack    (Consider location/radiation/quality/duration/timing/severity/associated sxs/prior treatment) HPI The patient presents with a request for assistance with detoxification. He notes that over the past week he's been drinking significant amounts, including half gallon of vodka today.  He also notes that he used cocaine 4 days ago, approximately 3 inches worth.  He denies any new pain, any new suicidal thoughts or if he does note that he has increasing anxiety, and this has become worse as he has been consuming more illicit substances. The patient last hospitalized for inpatient rehabilitation within the past year. Beyond the aforementioned complaints, the patient denies any recent health changes, any other new medications or new illicit substances. Past Medical History  Diagnosis Date  . Bipolar affective disorder   . Anxiety   . Trauma to brain    Past Surgical History  Procedure Date  . 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 No pertinent family history.  History  Substance Use Topics  . Smoking status: Current Everyday Smoker    Types: Cigars, Cigarettes  . Smokeless tobacco: Not on file  . Alcohol Use: 10.8 oz/week    18 Cans of beer per week      Review of Systems  Constitutional:       Per HPI, otherwise negative  HENT:       Per HPI, otherwise negative  Eyes: Negative.   Respiratory:       Per HPI, otherwise negative  Cardiovascular:       Per HPI, otherwise negative  Gastrointestinal: Negative for vomiting.  Genitourinary: Negative.   Musculoskeletal:       Per HPI, otherwise negative  Skin: Negative.   Neurological: Negative for syncope.  Psychiatric/Behavioral: Positive for sleep disturbance. Negative for suicidal ideas,  hallucinations and self-injury. The patient is nervous/anxious and is hyperactive.     Allergies  Hydrocodone  Home Medications   Current Outpatient Rx  Name Route Sig Dispense Refill  . OXYCODONE-ACETAMINOPHEN 10-325 MG PO TABS Oral Take 1 tablet by mouth 4 (four) times daily.    Marland Kitchen ALPRAZOLAM 2 MG PO TABS Oral Take 2 mg by mouth 4 (four) times daily as needed. For anxiety    . DIAZEPAM 5 MG PO TABS Oral Take 10 mg by mouth 4 (four) times daily. for spasm      There were no vitals taken for this visit.  Physical Exam  Nursing note and vitals reviewed. Constitutional: He is oriented to person, place, and time. He appears well-developed. No distress.  HENT:  Head: Normocephalic and atraumatic.  Eyes: Conjunctivae and EOM are normal.  Cardiovascular: Normal rate and regular rhythm.   Pulmonary/Chest: Effort normal. No stridor. No respiratory distress.  Abdominal: He exhibits no distension.  Musculoskeletal: He exhibits no edema.  Neurological: He is alert and oriented to person, place, and time.  Skin: Skin is warm and dry.  Psychiatric: He has a normal mood and affect.    ED Course  Procedures (including critical care time)   Labs Reviewed  URINE RAPID DRUG SCREEN (HOSP PERFORMED)  CBC  COMPREHENSIVE METABOLIC PANEL  ETHANOL  URINALYSIS, ROUTINE W REFLEX MICROSCOPIC   No results found.   No diagnosis found.  MDM  This young male with history of bipolar disease, prior substance abuse detoxification as an inpatient now presents with request for assistance with detoxification and anxiety. On exam he is in no distress, though he does seem anxious. The patient will have laboratory evaluation, and is clear for behavioral health evaluation. The case has been discussed with behavioral health, and the patient will be reassessed when sober.        Gerhard Munch, MD 11/05/11 0010

## 2011-11-05 NOTE — Progress Notes (Signed)
Pt was accepted [facility] and signed out AMA prior to my arrival

## 2011-11-05 NOTE — Progress Notes (Signed)
1600 Patient is awaiting placement.

## 2011-11-05 NOTE — ED Notes (Signed)
Pt came out to desk, Says he wants to leave.  "IVe been here too long".  Left with brother. Signed AMA form.  Dr Adriana Simas aware.

## 2011-11-05 NOTE — ED Notes (Signed)
Pt stated "I still feel anxious but it takes a lot to put me down". Pt resting in bed at this time, in NAD. Will continue to monitor.

## 2011-11-05 NOTE — ED Notes (Signed)
sleeping

## 2012-01-02 ENCOUNTER — Encounter (HOSPITAL_COMMUNITY): Payer: Self-pay | Admitting: *Deleted

## 2012-01-02 ENCOUNTER — Inpatient Hospital Stay (HOSPITAL_COMMUNITY)
Admission: AD | Admit: 2012-01-02 | Discharge: 2012-01-03 | DRG: 897 | Disposition: A | Discharge status: Home / Self Care | Payer: 59 | Source: Ambulatory Visit | Attending: Psychiatry | Admitting: Psychiatry

## 2012-01-02 ENCOUNTER — Other Ambulatory Visit: Payer: Self-pay

## 2012-01-02 ENCOUNTER — Emergency Department (HOSPITAL_COMMUNITY)
Admission: EM | Admit: 2012-01-02 | Discharge: 2012-01-02 | Disposition: A | Discharge status: Home / Self Care | Payer: 59 | Source: Home / Self Care | Attending: Emergency Medicine | Admitting: Emergency Medicine

## 2012-01-02 ENCOUNTER — Emergency Department (HOSPITAL_COMMUNITY): Payer: 59

## 2012-01-02 DIAGNOSIS — F1920 Other psychoactive substance dependence, uncomplicated: Secondary | ICD-10-CM

## 2012-01-02 DIAGNOSIS — R1310 Dysphagia, unspecified: Secondary | ICD-10-CM | POA: Insufficient documentation

## 2012-01-02 DIAGNOSIS — R000 Tachycardia, unspecified: Secondary | ICD-10-CM | POA: Insufficient documentation

## 2012-01-02 DIAGNOSIS — F1994 Other psychoactive substance use, unspecified with psychoactive substance-induced mood disorder: Secondary | ICD-10-CM

## 2012-01-02 DIAGNOSIS — F411 Generalized anxiety disorder: Secondary | ICD-10-CM | POA: Insufficient documentation

## 2012-01-02 DIAGNOSIS — F1010 Alcohol abuse, uncomplicated: Secondary | ICD-10-CM | POA: Insufficient documentation

## 2012-01-02 DIAGNOSIS — F1410 Cocaine abuse, uncomplicated: Secondary | ICD-10-CM

## 2012-01-02 DIAGNOSIS — R079 Chest pain, unspecified: Secondary | ICD-10-CM | POA: Insufficient documentation

## 2012-01-02 DIAGNOSIS — F419 Anxiety disorder, unspecified: Secondary | ICD-10-CM

## 2012-01-02 DIAGNOSIS — F319 Bipolar disorder, unspecified: Secondary | ICD-10-CM

## 2012-01-02 DIAGNOSIS — R45851 Suicidal ideations: Secondary | ICD-10-CM

## 2012-01-02 DIAGNOSIS — F1910 Other psychoactive substance abuse, uncomplicated: Secondary | ICD-10-CM

## 2012-01-02 DIAGNOSIS — F1310 Sedative, hypnotic or anxiolytic abuse, uncomplicated: Secondary | ICD-10-CM

## 2012-01-02 DIAGNOSIS — Z888 Allergy status to other drugs, medicaments and biological substances status: Secondary | ICD-10-CM

## 2012-01-02 DIAGNOSIS — R1013 Epigastric pain: Secondary | ICD-10-CM | POA: Insufficient documentation

## 2012-01-02 LAB — RAPID URINE DRUG SCREEN, HOSP PERFORMED
Barbiturates: NOT DETECTED
Benzodiazepines: POSITIVE — AB

## 2012-01-02 LAB — DIFFERENTIAL
Basophils Absolute: 0 10*3/uL (ref 0.0–0.1)
Lymphocytes Relative: 25 % (ref 12–46)
Lymphs Abs: 2.3 10*3/uL (ref 0.7–4.0)
Monocytes Absolute: 0.8 10*3/uL (ref 0.1–1.0)
Neutro Abs: 5.8 10*3/uL (ref 1.7–7.7)

## 2012-01-02 LAB — COMPREHENSIVE METABOLIC PANEL
ALT: 49 U/L (ref 0–53)
AST: 27 U/L (ref 0–37)
CO2: 26 mEq/L (ref 19–32)
Chloride: 99 mEq/L (ref 96–112)
Creatinine, Ser: 1.19 mg/dL (ref 0.50–1.35)
GFR calc Af Amer: 88 mL/min — ABNORMAL LOW (ref >90)
GFR calc non Af Amer: 76 mL/min — ABNORMAL LOW (ref >90)
Glucose, Bld: 119 mg/dL — ABNORMAL HIGH (ref 70–99)
Sodium: 137 mEq/L (ref 135–145)
Total Bilirubin: 0.5 mg/dL (ref 0.3–1.2)

## 2012-01-02 LAB — CBC
HCT: 45.8 % (ref 39.0–52.0)
MCV: 92.2 fL (ref 78.0–100.0)
RBC: 4.97 MIL/uL (ref 4.22–5.81)
RDW: 13 % (ref 11.5–15.5)
WBC: 8.9 10*3/uL (ref 4.0–10.5)

## 2012-01-02 LAB — ETHANOL: Alcohol, Ethyl (B): 17 mg/dL — ABNORMAL HIGH (ref 0–11)

## 2012-01-02 MED ORDER — ACETAMINOPHEN 325 MG PO TABS: 650.0000 mg | ORAL_TABLET | Freq: Four times a day (QID) | ORAL | Status: DC | PRN

## 2012-01-02 MED ORDER — CHLORDIAZEPOXIDE HCL 25 MG PO CAPS
25.0000 mg | ORAL_CAPSULE | Freq: Four times a day (QID) | ORAL | Status: DC
  Filled 2012-01-02 (×2): qty 1

## 2012-01-02 MED ORDER — QUETIAPINE FUMARATE 50 MG PO TABS
50.0000 mg | ORAL_TABLET | Freq: Every day | ORAL | Status: DC
  Filled 2012-01-02 (×4): qty 1

## 2012-01-02 MED ORDER — ADULT MULTIVITAMIN W/MINERALS CH: 1.0000 | ORAL_TABLET | Freq: Every day | ORAL | Status: DC

## 2012-01-02 MED ORDER — HYDROXYZINE PAMOATE 25 MG PO CAPS: 50.0000 mg | ORAL_CAPSULE | Freq: Every evening | ORAL | Status: DC | PRN

## 2012-01-02 MED ORDER — VITAMIN B-1 100 MG PO TABS
100.0000 mg | ORAL_TABLET | Freq: Every day | ORAL | Status: DC
  Filled 2012-01-02 (×3): qty 1

## 2012-01-02 MED ORDER — LOPERAMIDE HCL 2 MG PO CAPS: 2.0000 mg | ORAL_CAPSULE | ORAL | Status: DC | PRN

## 2012-01-02 MED ORDER — HYDROXYZINE HCL 25 MG PO TABS: 25.0000 mg | ORAL_TABLET | Freq: Four times a day (QID) | ORAL | Status: DC | PRN

## 2012-01-02 MED ORDER — CHLORDIAZEPOXIDE HCL 25 MG PO CAPS: 25.0000 mg | ORAL_CAPSULE | ORAL | Status: DC

## 2012-01-02 MED ORDER — LORAZEPAM 1 MG PO TABS
ORAL_TABLET | ORAL | Status: AC
  Administered 2012-01-02: 0.5 mg
  Filled 2012-01-02: qty 1

## 2012-01-02 MED ORDER — VITAMIN B-1 100 MG PO TABS
100.0000 mg | ORAL_TABLET | Freq: Every day | ORAL | Status: DC
  Filled 2012-01-02: qty 1

## 2012-01-02 MED ORDER — MAGNESIUM HYDROXIDE 400 MG/5ML PO SUSP: 30.0000 mL | Freq: Every day | ORAL | Status: DC | PRN

## 2012-01-02 MED ORDER — CHLORDIAZEPOXIDE HCL 25 MG PO CAPS: 25.0000 mg | ORAL_CAPSULE | Freq: Every day | ORAL | Status: DC

## 2012-01-02 MED ORDER — LORAZEPAM 1 MG PO TABS
1.0000 mg | ORAL_TABLET | Freq: Once | ORAL | Status: AC
Start: 2012-01-02 — End: 2012-01-02
  Administered 2012-01-02: 1 mg via ORAL
  Filled 2012-01-02: qty 1

## 2012-01-02 MED ORDER — THIAMINE HCL 100 MG/ML IJ SOLN: 100.0000 mg | Freq: Once | INTRAMUSCULAR | Status: DC

## 2012-01-02 MED ORDER — ALUM & MAG HYDROXIDE-SIMETH 200-200-20 MG/5ML PO SUSP: 30.0000 mL | ORAL | Status: DC | PRN

## 2012-01-02 MED ORDER — CHLORDIAZEPOXIDE HCL 25 MG PO CAPS: 25.0000 mg | ORAL_CAPSULE | Freq: Three times a day (TID) | ORAL | Status: DC

## 2012-01-02 MED ORDER — ADULT MULTIVITAMIN W/MINERALS CH
1.0000 | ORAL_TABLET | Freq: Every day | ORAL | Status: DC
  Administered 2012-01-02: 1 via ORAL
  Filled 2012-01-02 (×4): qty 1

## 2012-01-02 MED ORDER — ONDANSETRON 4 MG PO TBDP: 4.0000 mg | ORAL_TABLET | Freq: Four times a day (QID) | ORAL | Status: DC | PRN

## 2012-01-02 MED ORDER — SODIUM CHLORIDE 0.9 % IV SOLN
Freq: Once | INTRAVENOUS | Status: AC
  Administered 2012-01-02: 125 mL/h via INTRAVENOUS

## 2012-01-02 MED ORDER — CHLORDIAZEPOXIDE HCL 25 MG PO CAPS: 25.0000 mg | ORAL_CAPSULE | Freq: Four times a day (QID) | ORAL | Status: DC | PRN

## 2012-01-02 MED ORDER — ALPRAZOLAM 0.5 MG PO TABS
ORAL_TABLET | ORAL | Status: AC
  Administered 2012-01-02: 2 mg
  Filled 2012-01-02: qty 4

## 2012-01-02 MED ORDER — CHLORDIAZEPOXIDE HCL 25 MG PO CAPS
25.0000 mg | ORAL_CAPSULE | Freq: Four times a day (QID) | ORAL | Status: DC
  Administered 2012-01-02: 25 mg via ORAL
  Filled 2012-01-02: qty 1

## 2012-01-02 NOTE — Progress Notes (Signed)
Pt is in the bed at this time.  Offered him his HS medications, but he is refusing all meds and also refused to let the MHT check his VS earlier.  Pt signed a 72hr request for discharge around 1900.  He is upset that his Valium and Xanax were not continued here.  PA explained to him why he would not be prescribed those meds here and he became angry and wanted to be discharged.  At this time pt is "politely" saying he is not taking any meds tonight until he speaks with the MD in the AM.  He voices no other concerns.  Safety maintained with q15 minute checks.
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
## 2012-01-02 NOTE — ED Notes (Signed)
States no pain at present "just disappointed in myself".  C/o extreme dry mouth  Ice chip po given

## 2012-01-02 NOTE — Progress Notes (Signed)
Gary Stanley has been accepted to [facility] by Lonzo Cloud NP to Dr. Koren Shiver. He will be transported by [employer]. Called [employer] to obtain Medicaid approval. Spoke with Clydie Braun, who authorized 2 days, 3/13 and 01/03/12.Support paperwork completed, copied, and sent with patient. Dr Oletta Lamas is in agreement with the disposition.

## 2012-01-02 NOTE — ED Notes (Signed)
Returned from radiology  Monitor NSR without ectopy. No complaints at present.
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
## 2012-01-02 NOTE — Progress Notes (Signed)
Patient ID: Gary Stanley, male   DOB: [birth year], 41 y.o.   MRN: [id number] Voluntary admission, pt states that he got out of jail on March 4 of this month and since he got out he used drugs and was drinking. Pt also states that he was not on any medications while he was in jail, says he is bipolar and also that he was hearing voices, pt does state that he needs to get back on his medications, states he can not take any seroquel as it makes his legs shake, pt did ask for xanax or vaiuim on admission, pt was given librium per protocol. Pt denies any suicidal thoughts and states he has never been suicidal, pt also able to contract for safety on the unit, pt was oriented to unit and safety maintained

## 2012-01-02 NOTE — ED Notes (Signed)
Pt just left. Attempting to call report to [facility]

## 2012-01-02 NOTE — ED Provider Notes (Signed)
9:34 AM  Pt handoff from Dr. Freida Busman.  ACT counselor is seeing patient for substance abuse.  Pt would like inpt treatment.  No SI.  Pt is cleared medically.     2:02 PM Pt accepted to [facility] by Dr. Catha Brow.    Gavin Pound. Simona Rocque, MD 01/02/12 1402

## 2012-01-02 NOTE — ED Notes (Signed)
Pt to department via EMS.  Pt reports experiencing chest pain after drinking "alot".  States "maybe over a fifth".  Also reports doing cocaine tonight.  Reports chest pain began after doing cocaine.  Pt given 4 ASA and 1 SL nitro by EMS

## 2012-01-03 NOTE — [facility] Suicide Risk Assessment (Signed)
Suicide Risk Assessment  Admission/Discharge Assessment       Demographic factors: See chart.    Current Mental Status:  Patient seen and evaluated. Chart reviewed. Patient stated that his mood was "fine". His affect was mood congruent and euthymic. He denied any current thoughts of self injurious behavior, suicidal ideation or homicidal ideation. He denied any significant depressive signs or symptoms at this time. There were no auditory or visual hallucinations, paranoia, delusional thought processes, or mania noted.  Thought process was linear and goal directed.  No psychomotor agitation or retardation was noted. His speech was slightly presurred. Eye contact was good. Judgment and insight are fair.  Patient has been up and engaged on the unit.  No acute safety concerns reported from team.  Denied any w/d s/s.  VS:  Filed Vitals:   01/02/12 1752  Pulse: 94  Temp: 98.1 F (36.7 C)  Resp: 18    Loss Factors: Legal issues; Financial problems / change in socioeconomic status; has to return to jail for split sentence on 01/05/12 for B&E  Historical Factors: Family history of mental illness or substance abuse; completed [facility] in past; diagnosed with BPAD in past and was treated with Seroquel with SEs; denied hx w/d seizures or DTs; denied hx SI/attempts/plans/HI/hx assault charges  Risk Reduction Factors:  Living with another person, especially a relative;Positive social support;Positive therapeutic relationship; church; sees Dr. Tiburcio Pea- f/u in May s/p jail time  CLINICAL FACTORS: Alcohol Dependence; Cocaine Abuse; TBI; r/o BPAD, per Hx    COGNITIVE FEATURES THAT CONTRIBUTE TO RISK: limited insight, memory problems, cognitive issues s/p injury.  SUICIDE RISK: Patient is currently viewed as a low risk of harm to himself in light of his history and risk factors. There are no acute safety concerns and he is stable for discharge. His continued sobriety, medication management and followup will  mitigate against any potential risk in the future. He is agreeable with the plan.  Discussed with the team.  PLAN OF CARE: Pt stable for and requesting discharge. Pt contracting for safety and does not currently meet [place] involuntary commitment criteria for continued hospitalization against his will.  Mental health treatment, medication management and continued sobriety will mitigate against the increased risk of harm to self and/or others.  Discussed the importance of recovery further with pt, as well as, tools to move forward in a healthy & safe manner.  Pt agreeable with the plan.  Discussed with the team.  Please see orders, follow up plans per team and full discharge summary completed by physician extender.   Lupe Carney 01/03/2012, 2:45 PM

## 2012-01-03 NOTE — Progress Notes (Signed)
[facility] Group Notes:  (Counselor/Nursing/MHT/Case Management/Adjunct)  01/03/2012 3:20 PM  Type of Therapy:  1:15PM Group Therapy  Participation Level:  Did Not Attend  Wilmon Arms 01/03/2012, 3:20 PM

## 2012-01-03 NOTE — Tx Team (Signed)
Initial Interdisciplinary Treatment Plan  PATIENT STRENGTHS: (choose at least two) Average or above average intelligence Capable of independent living General fund of knowledge Motivation for treatment/growth  PATIENT STRESSORS: Financial difficulties Legal issue Substance abuse   PROBLEM LIST: Problem List/Patient Goals Date to be addressed Date deferred Reason deferred Estimated date of resolution  Polysubstance abuse       Auditory hallucinations       Legal issues      Wants to get back on his meds                                     DISCHARGE CRITERIA:  Ability to meet basic life and health needs Improved stabilization in mood, thinking, and/or behavior Motivation to continue treatment in a less acute level of care Safe-care adequate arrangements made Verbal commitment to aftercare and medication compliance Withdrawal symptoms are absent or subacute and managed without 24-hour nursing intervention  PRELIMINARY DISCHARGE PLAN: Attend aftercare/continuing care group Attend 12-step recovery group Outpatient therapy Placement in alternative living arrangements  PATIENT/FAMIILY INVOLVEMENT: This treatment plan has been presented to and reviewed with the patient, Gary Stanley, and/or family member.  The patient and family have been given the opportunity to ask questions and make suggestions.  Jesus Genera [facility] 01/03/2012, 2:54 AM
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
## 2012-01-03 NOTE — Progress Notes (Signed)
[facility] Group Notes:  (Counselor/Nursing/MHT/Case Management/Adjunct)  01/03/2012 12:50 PM  Type of Therapy:  Group Therapy  Participation Level:  Did Not Attend  Gary Stanley 01/03/2012, 12:50 PM

## 2012-01-03 NOTE — Discharge Planning (Signed)
Met with pt briefly on this day. Pt reports that he recently discharged from jail after doing time for a 60 day split sentence for a DWI. Pt states that he relapsed after being released from jail and began to use cocaine, opiates, and drink. Pt states that his only reason for coming to the hospital was to get help with his detox. Pt is refusing meds, and and f/u care. Pt states that he has to report back to jail on 3/16 to finish his sentence and that his only focus is to go home.

## 2012-01-03 NOTE — Progress Notes (Signed)
Patient ID: Gary Stanley, male   DOB: 10/13/1971, 41 y.o.   MRN: [id number] He has been in room today refusing to take medication and did not go to groups did go for meals. He said that he did not need to detox thus he was not going to take med or attend groups. He has been calm and cooperative wanting to see Dr.
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
## 2012-01-03 NOTE — Progress Notes (Signed)
Counselor had one to one conversation with patient after he met with psychiatrist. Narcotic anonymous and alcoholic anonymous support groups and meetings were discussed; patient adamantly states that his belief in [place] is what can and we'll keep him sober versus meetings. Patient did agree to support groups being an option if he fails to remain clean after release from upcoming jail sentence. 24 hour mobile crisis team services were discussed with patient and contact information  provided in chart for patient at discharge. Patient provided access to phone room in order to call his father for discharge pick up  Clide Dales 01/03/2012 3:54 PM

## 2012-01-08 NOTE — Progress Notes (Signed)
Patient Discharge Instructions:  Patient declined follow up.  Wandra Scot, 01/08/2012, 1:59 PM

## 2012-02-03 ENCOUNTER — Encounter (HOSPITAL_COMMUNITY): Payer: Self-pay | Admitting: *Deleted

## 2012-02-03 ENCOUNTER — Emergency Department (HOSPITAL_COMMUNITY)
Admission: EM | Admit: 2012-02-03 | Discharge: 2012-02-03 | Disposition: A | Discharge status: Home / Self Care | Payer: 59 | Attending: Emergency Medicine | Admitting: Emergency Medicine

## 2012-02-03 DIAGNOSIS — F1010 Alcohol abuse, uncomplicated: Secondary | ICD-10-CM

## 2012-02-03 DIAGNOSIS — R079 Chest pain, unspecified: Secondary | ICD-10-CM | POA: Insufficient documentation

## 2012-02-03 HISTORY — DX: Alcohol abuse, uncomplicated: F10.10

## 2012-02-03 HISTORY — DX: Cocaine abuse, uncomplicated: F14.10

## 2012-02-03 LAB — RAPID URINE DRUG SCREEN, HOSP PERFORMED
Amphetamines: NOT DETECTED
Benzodiazepines: POSITIVE — AB
Cocaine: POSITIVE — AB
Opiates: NOT DETECTED
Tetrahydrocannabinol: NOT DETECTED

## 2012-02-03 LAB — CBC
HCT: 46.5 % (ref 39.0–52.0)
MCV: 93 fL (ref 78.0–100.0)
Platelets: 264 10*3/uL (ref 150–400)
RBC: 5 MIL/uL (ref 4.22–5.81)
WBC: 8 10*3/uL (ref 4.0–10.5)

## 2012-02-03 LAB — ETHANOL: Alcohol, Ethyl (B): 98 mg/dL — ABNORMAL HIGH (ref 0–11)

## 2012-02-03 LAB — DIFFERENTIAL
Eosinophils Relative: 1 % (ref 0–5)
Lymphocytes Relative: 36 % (ref 12–46)
Lymphs Abs: 2.9 10*3/uL (ref 0.7–4.0)
Monocytes Absolute: 1 10*3/uL (ref 0.1–1.0)
Neutro Abs: 4 10*3/uL (ref 1.7–7.7)

## 2012-02-03 LAB — COMPREHENSIVE METABOLIC PANEL
ALT: 60 U/L — ABNORMAL HIGH (ref 0–53)
AST: 31 U/L (ref 0–37)
Albumin: 4.1 g/dL (ref 3.5–5.2)
Alkaline Phosphatase: 97 U/L (ref 39–117)
Calcium: 9.1 mg/dL (ref 8.4–10.5)
GFR calc Af Amer: >90 mL/min (ref >90)
Glucose, Bld: 113 mg/dL — ABNORMAL HIGH (ref 70–99)
Potassium: 4 mEq/L (ref 3.5–5.1)
Sodium: 135 mEq/L (ref 135–145)
Total Protein: 7.7 g/dL (ref 6.0–8.3)

## 2012-02-03 LAB — CK TOTAL AND CKMB (NOT AT ARMC)
Relative Index: 2 (ref 0.0–2.5)
Total CK: 118 U/L (ref 7–232)

## 2012-02-03 MED ORDER — LORAZEPAM 1 MG PO TABS
2.0000 mg | ORAL_TABLET | Freq: Once | ORAL | Status: AC
  Administered 2012-02-03: 2 mg via ORAL
  Filled 2012-02-03: qty 2

## 2012-02-03 NOTE — ED Notes (Signed)
ACT team states that patient has been accepted at [facility], but that patient has no ride to facility.  Informed charge RN and questioned about cab voucher; charge states that he would work on the situation.  ACT team notified of update.  Patient has one hour from time of discharge to arrive at facility.  Patient aware.  Updated Dr. Radford Pax so that he can prepare for disposition.

## 2012-02-03 NOTE — ED Notes (Signed)
Received bedside report from [place], Charity fundraiser.  Patient ambulated to bathroom with assistance.  Patient now sitting up in bed; no respiratory or acute distress noted.  Patient denies pain at this time, but patient is requesting something for his withdrawal symptoms.  Informed patient that nothing is ordered PRN at that EDP will be asked for PRN orders.  Patient has no other questions or concerns at this time.  Patient updated on plan of care; informed patient that ACT team is working on getting patient accepted to [facility].  Patient has no other questions or concerns at this time; will continue to monitor.

## 2012-02-03 NOTE — ED Notes (Signed)
The pt has been drinking alcohol and doing cocaine for 3 days .  approx one hour ago  He developed severe lt upper chest pain.  The pain at present is not as severe as it was earlier. Pt crying now.

## 2012-02-03 NOTE — [facility] Counselor (Signed)
Patient was accepted at [facility] and authorization number from [employer] obtained for a 3 days (auth# [id number]) and [facility] stated patient would need to provide his own transportation because they are unable to assist outside of [facility]. Patient was informed of plan and he was unable to provide his own transportation and had no money for a cab. Counselor spoke with Herbert Seta, RN and she was able to obtain a cab voucher for patient and stated she inform MD for discharge. Patient was informed of [facility] policy that he must arrive within an hour following discharge from the hospital in order to receive treatment. Counselor reviewed with Herbert Seta, RN that [facility] should be called when patient has been discharged and that is the time that [facility] will begin counting the hour.   Issabela Lesko S. Corine Shelter MS, LPCA
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
## 2012-02-03 NOTE — Discharge Instructions (Signed)
Alcohol Problems Most adults who drink alcohol drink in moderation (not a lot) are at low risk for developing problems related to their drinking. However, all drinkers, including low-risk drinkers, should know about the health risks connected with drinking alcohol. RECOMMENDATIONS FOR LOW-RISK DRINKING  Drink in moderation. Moderate drinking is defined as follows:   Men - no more than 2 drinks per day.   Nonpregnant women - no more than 1 drink per day.   Over age 97 - no more than 1 drink per day.  A standard drink is 12 grams of pure alcohol, which is equal to a 12 ounce bottle of beer or wine cooler, a 5 ounce glass of wine, or 1.5 ounces of distilled spirits (such as whiskey, brandy, vodka, or rum).  ABSTAIN FROM (DO NOT DRINK) ALCOHOL:  When pregnant or considering pregnancy.   When taking a medication that interacts with alcohol.   If you are alcohol dependent.   A medical condition that prohibits drinking alcohol (such as ulcer, liver disease, or heart disease).  DISCUSS WITH YOUR CAREGIVER:  If you are at risk for coronary heart disease, discuss the potential benefits and risks of alcohol use: Light to moderate drinking is associated with lower rates of coronary heart disease in certain populations (for example, men over age 87 and postmenopausal women). Infrequent or nondrinkers are advised not to begin light to moderate drinking to reduce the risk of coronary heart disease so as to avoid creating an alcohol-related problem. Similar protective effects can likely be gained through proper diet and exercise.   Women and the elderly have smaller amounts of body water than men. As a result women and the elderly achieve a higher blood alcohol concentration after drinking the same amount of alcohol.   Exposing a fetus to alcohol can cause a broad range of birth defects referred to as Fetal Alcohol Syndrome (FAS) or Alcohol-Related Birth Defects (ARBD). Although FAS/ARBD is connected with  excessive alcohol consumption during pregnancy, studies also have reported neurobehavioral problems in infants born to mothers reporting drinking an average of 1 drink per day during pregnancy.   Heavier drinking (the consumption of more than 4 drinks per occasion by men and more than 3 drinks per occasion by women) impairs learning (cognitive) and psychomotor functions and increases the risk of alcohol-related problems, including accidents and injuries.  CAGE QUESTIONS:   Have you ever felt that you should Cut down on your drinking?   Have people Annoyed you by criticizing your drinking?   Have you ever felt bad or Guilty about your drinking?   Have you ever had a drink first thing in the morning to steady your nerves or get rid of a hangover (Eye opener)?  If you answered positively to any of these questions: You may be at risk for alcohol-related problems if alcohol consumption is:   Men: Greater than 14 drinks per week or more than 4 drinks per occasion.   Women: Greater than 7 drinks per week or more than 3 drinks per occasion.  Do you or your family have a medical history of alcohol-related problems, such as:  Blackouts.   Sexual dysfunction.   Depression.   Trauma.   Liver dysfunction.   Sleep disorders.   Hypertension.   Chronic abdominal pain.   Has your drinking ever caused you problems, such as problems with your family, problems with your work (or school) performance, or accidents/injuries?   Do you have a compulsion to drink or a preoccupation  Do you have a compulsion to drink or a preoccupation with drinking?   Do you have poor control or are you unable to stop drinking once you have started?   Do you have to drink to avoid withdrawal symptoms?   Do you have problems with withdrawal such as tremors, nausea, sweats, or mood disturbances?   Does it take more alcohol than in the past to get you high?   Do you feel a strong urge to drink?   Do you change your plans so that you can have a drink?   Do you ever drink in the morning to relieve  the shakes or a hangover?  If you have answered a number of the previous questions positively, it may be time for you to talk to your caregivers, family, and friends and see if they think you have a problem. Alcoholism is a chemical dependency that keeps getting worse and will eventually destroy your health and relationships. Many alcoholics end up dead, impoverished, or in prison. This is often the end result of all chemical dependency.   Do not be discouraged if you are not ready to take action immediately.   Decisions to change behavior often involve up and down desires to change and feeling like you cannot decide.   Try to think more seriously about your drinking behavior.   Think of the reasons to quit.  WHERE TO GO FOR ADDITIONAL INFORMATION    The National Institute on Alcohol Abuse and Alcoholism (NIAAA)www.niaaa.nih.gov   National Council on Alcoholism and Drug Dependence (NCADD)www.ncadd.org   American Society of Addiction Medicine (ASAM)www.asam.org  Document Released: 10/08/2005 Document Revised: 09/27/2011 Document Reviewed: 05/26/2008  [employer] Patient Information 2012 [employer].

## 2012-02-03 NOTE — ED Notes (Signed)
Pt ambulated to restroom, meal tray ordered and pt report given to Adonis Huguenin. RN

## 2012-02-03 NOTE — ED Notes (Signed)
Patient given discharge paperwork; went over discharge instructions with patient.  Instructed patient to ask secretary up front about cab voucher; charge nurse aware.  Patient informed to go to [facility] and that he has one hour to arrive at [facility] from time of discharge.  Patient instructed to return to the ED for new, worsening, or concerning symptoms.

## 2012-02-03 NOTE — ED Notes (Addendum)
Patient refuses to cooperate so I can take his temperature.

## 2012-02-03 NOTE — ED Notes (Signed)
PT given beverage per request 

## 2012-02-03 NOTE — ED Notes (Signed)
Patient currently sitting up in bed watching television; no respiratory or acute distress noted.  Patient has no questions or concerns at this time; will continue to monitor.

## 2012-02-03 NOTE — ED Notes (Signed)
nad ntoed abc intact will monitor pt.
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
## 2012-02-03 NOTE — ED Notes (Signed)
Patient reports that he is still hungry after dinner; given [place] sandwich and peanut butter crackers.  Dr. Radford Pax notified that patient requesting something for anxiety.  Will continue to monitor.

## 2012-02-03 NOTE — BHH Counselor (Addendum)
Pt was denied by [facility]; ACT counselor discussed with pt that he was not accepted at [facility]; pt states that he still wants help with getting his life together; cm inquired with pt if he would accept placement outside the county and pt agreed; ACT counselor sent referrals to [facility] and [facility]   Pt was denied at [facility] states they do not detox medicaid pts; pt still pending [facility]

## 2012-02-05 ENCOUNTER — Other Ambulatory Visit (HOSPITAL_COMMUNITY): Payer: Self-pay | Admitting: Orthopedic Surgery

## 2012-02-05 DIAGNOSIS — M25559 Pain in unspecified hip: Secondary | ICD-10-CM

## 2012-02-11 ENCOUNTER — Ambulatory Visit (HOSPITAL_COMMUNITY)
Admission: RE | Admit: 2012-02-11 | Discharge: 2012-02-11 | Disposition: A | Discharge status: Home / Self Care | Payer: Medicaid Other | Source: Ambulatory Visit | Attending: Orthopedic Surgery | Admitting: Orthopedic Surgery

## 2012-02-11 ENCOUNTER — Other Ambulatory Visit (HOSPITAL_COMMUNITY): Payer: Self-pay | Admitting: Orthopedic Surgery

## 2012-02-11 DIAGNOSIS — M25559 Pain in unspecified hip: Secondary | ICD-10-CM

## 2012-02-11 DIAGNOSIS — R209 Unspecified disturbances of skin sensation: Secondary | ICD-10-CM | POA: Insufficient documentation

## 2012-02-11 MED ORDER — IOHEXOL 300 MG/ML  SOLN
12.0000 mL | Freq: Once | INTRAMUSCULAR | Status: AC | PRN
  Administered 2012-02-11: 12 mL via INTRA_ARTICULAR

## 2012-02-11 MED ORDER — GADOBENATE DIMEGLUMINE 529 MG/ML IV SOLN
0.0600 mL | Freq: Once | INTRAVENOUS | Status: AC | PRN
  Administered 2012-02-11: 0.06 mL via INTRAVENOUS

## 2012-02-11 NOTE — Pre-Procedure Instructions (Signed)
Time out performed with Dr. Delma Officer.HaynesRN

## 2012-02-11 NOTE — Procedures (Signed)
Time-out procedure was performed.] The procedure and potential complications were discussed with the patient.  Informed written consent was obtained.  An appropriate skin entrance site was determined. The site was marked, prepped with Betadine, draped in the usual sterile fashion, and infiltrated locally with 1% Lidocaine.  A [22 gauge spinal] needle was advanced into the [left hip] joint under intermittent fluoroscopy.   [12] ml of a mixture of [12] ml [Omnipaque-300] in [0.06] ml of [Multihance] {and 2 ml of 1% Lidocaine} was then used to opacify the joint.  There were no immediate complications.

## 2012-02-28 ENCOUNTER — Emergency Department (HOSPITAL_COMMUNITY): Payer: 59

## 2012-02-28 ENCOUNTER — Encounter (HOSPITAL_COMMUNITY): Payer: Self-pay

## 2012-02-28 ENCOUNTER — Emergency Department (HOSPITAL_COMMUNITY)
Admission: EM | Admit: 2012-02-28 | Discharge: 2012-02-28 | Disposition: A | Discharge status: Home / Self Care | Payer: 59 | Attending: Emergency Medicine | Admitting: Emergency Medicine

## 2012-02-28 ENCOUNTER — Other Ambulatory Visit: Payer: Self-pay

## 2012-02-28 DIAGNOSIS — Z79899 Other long term (current) drug therapy: Secondary | ICD-10-CM | POA: Insufficient documentation

## 2012-02-28 DIAGNOSIS — F1410 Cocaine abuse, uncomplicated: Secondary | ICD-10-CM | POA: Insufficient documentation

## 2012-02-28 DIAGNOSIS — R079 Chest pain, unspecified: Secondary | ICD-10-CM | POA: Insufficient documentation

## 2012-02-28 DIAGNOSIS — F1910 Other psychoactive substance abuse, uncomplicated: Secondary | ICD-10-CM

## 2012-02-28 DIAGNOSIS — F1010 Alcohol abuse, uncomplicated: Secondary | ICD-10-CM | POA: Insufficient documentation

## 2012-02-28 DIAGNOSIS — F319 Bipolar disorder, unspecified: Secondary | ICD-10-CM | POA: Insufficient documentation

## 2012-02-28 LAB — TROPONIN I: Troponin I: <0.3 ng/mL (ref <0.30)

## 2012-02-28 LAB — ETHANOL: Alcohol, Ethyl (B): 96 mg/dL — ABNORMAL HIGH (ref 0–11)

## 2012-02-28 LAB — BASIC METABOLIC PANEL
CO2: 25 mEq/L (ref 19–32)
Calcium: 9.4 mg/dL (ref 8.4–10.5)
Creatinine, Ser: 1.02 mg/dL (ref 0.50–1.35)
GFR calc non Af Amer: >90 mL/min (ref >90)
Sodium: 140 mEq/L (ref 135–145)

## 2012-02-28 LAB — CBC
MCH: 33 pg (ref 26.0–34.0)
MCHC: 35.2 g/dL (ref 30.0–36.0)
MCV: 94 fL (ref 78.0–100.0)
Platelets: 261 10*3/uL (ref 150–400)
RDW: 13.1 % (ref 11.5–15.5)

## 2012-02-28 LAB — RAPID URINE DRUG SCREEN, HOSP PERFORMED
Barbiturates: NOT DETECTED
Benzodiazepines: POSITIVE — AB

## 2012-02-28 MED ORDER — ALBUTEROL SULFATE (5 MG/ML) 0.5% IN NEBU
2.5000 mg | INHALATION_SOLUTION | Freq: Once | RESPIRATORY_TRACT | Status: AC
  Administered 2012-02-28: 2.5 mg via RESPIRATORY_TRACT
  Filled 2012-02-28: qty 0.5

## 2012-02-28 NOTE — ED Notes (Signed)
Into room to assess patient. States he has been on a 7-day binge on cocaine and alcohol. Complaints of tightness in chest and slight shortness of breath. In no distress. Normal sinus on monitor at 81. 96% on room air. Placed on 2L O2 [place]. Clear lung sounds in all fields. S1 and S2 present. No extra heart sounds. Denies pain, just tightness in center of chest. Nothing makes tightness better or worse. Denies any nausea, vomiting, diarrhea. Denies intentions of wanting to hurt self. Call bell within reach. Bed in low position and locked with side rails up. Awaiting to be seen.

## 2012-02-28 NOTE — ED Notes (Signed)
Given warm blanket per request. Resting comfortably. No distress. Denies needs. Call bell at bedside. Lights dimmed for patient comfort.

## 2012-02-28 NOTE — ED Notes (Signed)
States he is feeling better after breathing treatment. Feels like lungs are less tight. Equal chest rise and fall, regular, unlabored. Call bell within reach. Clear lung sounds.

## 2012-02-28 NOTE — ED Notes (Signed)
Given crackers and soda per request. Denies any other needs. Denies pain. Call bell within reach.

## 2012-02-28 NOTE — Discharge Instructions (Signed)
Your labs here tonight were normal except your alcohol was a little high and your urine shows cocaine and benzodiazepines. Your heart number, xray and EKG were normal. Follow up with [facility] for help with substance abuse.

## 2012-02-28 NOTE — ED Notes (Signed)
MD at bedside. 

## 2012-02-28 NOTE — ED Notes (Signed)
Radiology at bedside for portable chest xray. In no distress. Equal chest rise and fall, regular, unlabored. Call bell within reach.

## 2012-02-28 NOTE — ED Notes (Signed)
Lab at bedside to draw blood.

## 2012-02-28 NOTE — ED Notes (Signed)
Obtaining urine specimen clean catch.

## 2012-02-28 NOTE — ED Notes (Signed)
MD at bedside to discuss plan of care

## 2012-02-28 NOTE — ED Notes (Signed)
Pt admits to cocaine/crack use for several days, c/o chest tightness tonight.

## 2012-02-28 NOTE — ED Notes (Signed)
RT at bedside for breathing treatment.  

## 2012-02-28 NOTE — ED Notes (Signed)
Resting with eyes closed and lights off. No distress. Equal chest rise and fall, regular, unlabored. Call bell within reach. Will continue to monitor.

## 2012-02-28 NOTE — ED Provider Notes (Signed)
History     CSN: [id number]  Arrival date & time 02/28/12  0200   First MD Initiated Contact with Patient 02/28/12 0216      Chief Complaint  Patient presents with  . Chest Pain    (Consider location/radiation/quality/duration/timing/severity/associated sxs/prior treatment) HPI OVADIA LOPP is a 41 y.o. male with a h/o polysubstance abuse, bipolar disorder, anxiety brought in by ambulance, who presents to the Emergency Department complaining of chest pain associated with the use of alcohol and cocaine. Patient reports he has been on a seven-day binge of cocaine and alcohol with little to eat. He's experienced chest pain off and on over the 7 day time frame. Currently he is pain-free.   Past Medical History  Diagnosis Date  . Bipolar affective disorder   . Anxiety   . 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 Alcohol abuse   . Cocaine abuse     Past Surgical History  Procedure Date  . Craniectomy     pt had surgery to remove blood clot from brain     No family history on file.  History  Substance Use Topics  . Smoking status: Unknown If Ever Smoked  . Smokeless tobacco: Not on file  . Alcohol Use: 10.8 oz/week    18 Cans of beer per week      Review of Systems  Constitutional: Negative for fever.       10 Systems reviewed and are negative for acute change except as noted in the HPI.  HENT: Negative for congestion.   Eyes: Negative for discharge and redness.  Respiratory: Negative for cough, chest tightness, shortness of breath and wheezing.   Cardiovascular: Positive for chest pain.  Gastrointestinal: Negative for vomiting and abdominal pain.  Musculoskeletal: Negative for back pain.  Skin: Negative for rash.  Neurological: Negative for syncope, numbness and headaches.  Psychiatric/Behavioral:       No behavior change.    Allergies  Hydrocodone  Home Medications   Current Outpatient Rx  Name Route Sig Dispense Refill  . ALPRAZOLAM 2 MG PO TABS Oral Take 2 mg by mouth 4  (four) times daily.    Marland Kitchen DIAZEPAM 10 MG PO TABS Oral Take 10 mg by mouth every 6 (six) hours as needed.    Marland Kitchen QUETIAPINE FUMARATE 50 MG PO TABS Oral Take 50 mg by mouth at bedtime.      BP 144/91  Pulse 82  Temp(Src) 98.2 F (36.8 C) (Oral)  Resp 18  Ht 5\' 11"  (1.803 m)  Wt 260 lb (117.935 kg)  BMI 36.26 kg/m2  SpO2 96%  Physical Exam  Nursing note and vitals reviewed. Constitutional: He appears well-developed and well-nourished.       Awake, alert, nontoxic appearance.  HENT:  Head: Atraumatic.  Eyes: Right eye exhibits no discharge. Left eye exhibits no discharge.  Neck: Neck supple.  Cardiovascular: Normal rate, normal heart sounds and intact distal pulses.   Pulmonary/Chest: Effort normal and breath sounds normal. He exhibits no tenderness.  Abdominal: Soft. There is no tenderness. There is no rebound.  Musculoskeletal: He exhibits no tenderness.       Baseline ROM, no obvious new focal weakness.  Neurological:       Mental status and motor strength appears baseline for patient and situation.  Skin: No rash noted.  Psychiatric: He has a normal mood and affect.       He denies suicidal ideation, homicidal ideation. He is not psychotic. He admits to alcohol and cocaine use.  ED Course  Procedures (including critical care time) Results for orders placed during the hospital encounter of 02/28/12  CBC      Component Value Range   WBC 8.5  4.0 - 10.5 (K/uL)   RBC 4.66  4.22 - 5.81 (MIL/uL)   Hemoglobin 15.4  13.0 - 17.0 (g/dL)   HCT 16.1  09.6 - 04.5 (%)   MCV 94.0  78.0 - 100.0 (fL)   MCH 33.0  26.0 - 34.0 (pg)   MCHC 35.2  30.0 - 36.0 (g/dL)   RDW 40.9  81.1 - 91.4 (%)   Platelets 261  150 - 400 (K/uL)  BASIC METABOLIC PANEL      Component Value Range   Sodium 140  135 - 145 (mEq/L)   Potassium 3.5  3.5 - 5.1 (mEq/L)   Chloride 102  96 - 112 (mEq/L)   CO2 25  19 - 32 (mEq/L)   Glucose, Bld 115 (*) 70 - 99 (mg/dL)   BUN 8  6 - 23 (mg/dL)   Creatinine, Ser  7.82  0.50 - 1.35 (mg/dL)   Calcium 9.4  8.4 - 95.6 (mg/dL)   GFR calc non Af Amer >90  >90 (mL/min)   GFR calc Af Amer >90  >90 (mL/min)  TROPONIN I      Component Value Range   Troponin I <0.30  <0.30 (ng/mL)  ETHANOL      Component Value Range   Alcohol, Ethyl (B) 96 (*) 0 - 11 (mg/dL)  URINE RAPID DRUG SCREEN (HOSP PERFORMED)      Component Value Range   Opiates NONE DETECTED  NONE DETECTED    Cocaine POSITIVE (*) NONE DETECTED    Benzodiazepines POSITIVE (*) NONE DETECTED    Amphetamines NONE DETECTED  NONE DETECTED    Tetrahydrocannabinol NONE DETECTED  NONE DETECTED    Barbiturates NONE DETECTED  NONE DETECTED     Date: 02/28/2012  0207  Rate:82  Rhythm: normal sinus rhythm  QRS Axis: normal  Intervals: normal  ST/T Wave abnormalities: normal  Conduction Disutrbances:none  Narrative Interpretation:   Old EKG Reviewed: unchanged c/w 02/03/12  Dg Chest Port 1 View  02/28/2012  *RADIOLOGY REPORT*  Clinical Data: Chest pain.  PORTABLE CHEST - 1 VIEW  Comparison: Chest radiograph performed 01/02/2012  Findings: The lungs are mildly hypoexpanded.  Mild vascular congestion and vascular crowding are noted, without evidence of pulmonary edema. Underlying chronic bronchitic changes are seen. There is no evidence of focal opacification, pleural effusion or pneumothorax.  The cardiomediastinal silhouette is mildly enlarged.  No acute osseous abnormalities are seen.  IMPRESSION: Lungs mildly hypoexpanded; mild vascular congestion and cardiomegaly, without evidence of edema.  Original Report Authenticated By: Tonia Ghent, M.D.    MDM  Patient with polysubstance abuse here with chest tightness and discomfort after using cocaine and alcohol.troponin is negative. EKG is unchanged from previous EKGs. Chest x-ray is without acute findings. Urine drug screen is positive for benzodiazepines and cocaine. Alcohol is 96. Patient does not want help at this time with polysubstance abuse. He has been  given a referral to [facility]. Pt stable in ED with no significant deterioration in condition.The patient appears reasonably screened and/or stabilized for discharge and I doubt any other medical condition or other [facility] requiring further screening, evaluation, or treatment in the ED at this time prior to discharge.  MDM Reviewed: nursing note, vitals and previous chart Interpretation: labs, ECG and x-ray          Nicoletta Dress.  Colon Branch, MD 02/28/12 [phone]

## 2012-04-23 ENCOUNTER — Encounter (HOSPITAL_COMMUNITY): Payer: Self-pay | Admitting: Emergency Medicine

## 2012-04-23 ENCOUNTER — Emergency Department (HOSPITAL_COMMUNITY)
Admission: EM | Admit: 2012-04-23 | Discharge: 2012-04-23 | Disposition: A | Discharge status: Home / Self Care | Payer: Medicaid Other | Attending: Emergency Medicine | Admitting: Emergency Medicine

## 2012-04-23 DIAGNOSIS — Z79899 Other long term (current) drug therapy: Secondary | ICD-10-CM | POA: Insufficient documentation

## 2012-04-23 DIAGNOSIS — Z760 Encounter for issue of repeat prescription: Secondary | ICD-10-CM

## 2012-04-23 DIAGNOSIS — F411 Generalized anxiety disorder: Secondary | ICD-10-CM | POA: Insufficient documentation

## 2012-04-23 DIAGNOSIS — Z885 Allergy status to narcotic agent status: Secondary | ICD-10-CM | POA: Insufficient documentation

## 2012-04-23 DIAGNOSIS — F319 Bipolar disorder, unspecified: Secondary | ICD-10-CM | POA: Insufficient documentation

## 2012-04-23 DIAGNOSIS — Z87820 Personal history of traumatic brain injury: Secondary | ICD-10-CM | POA: Insufficient documentation

## 2012-04-23 MED ORDER — QUETIAPINE FUMARATE 50 MG PO TABS: 50.0000 mg | ORAL_TABLET | Freq: Every day | ORAL | Status: DC

## 2012-04-23 MED ORDER — DIAZEPAM 10 MG PO TABS: 10.0000 mg | ORAL_TABLET | Freq: Four times a day (QID) | ORAL | Status: DC | PRN

## 2012-04-23 MED ORDER — ALPRAZOLAM 2 MG PO TABS: 2.0000 mg | ORAL_TABLET | Freq: Four times a day (QID) | ORAL | Status: DC

## 2012-04-23 NOTE — ED Provider Notes (Signed)
Medical screening examination/treatment/procedure(s) were performed by non-physician practitioner and as supervising physician I was immediately available for consultation/collaboration. Devoria Albe, MD, Armando Gang   Ward Givens, MD 04/23/12 [phone]

## 2012-04-23 NOTE — ED Notes (Signed)
Pt ran out of medications and unable to see new doctor for a month, meds ran out today

## 2012-04-23 NOTE — ED Provider Notes (Signed)
History     CSN: [id number]  Arrival date & time 04/23/12  4098   First MD Initiated Contact with Patient 04/23/12 [phone]      Chief Complaint  Patient presents with  . Medication Refill    (Consider location/radiation/quality/duration/timing/severity/associated sxs/prior treatment) HPI Comments: Gary Stanley presents for medication refill.  He has taken his last dose of his Seroquel which he uses for bipolar disorder 2 nights ago, took his last dose of Valium ( which is prescribed for muscle spasm and nerve damage associated with head trauma) last night, and his last dose of Xanax this morning which he uses for anxiety.  He is establishing care with [facility] here in [place] and has his first appointment on August 2, in the interim requires prescription refills for these medications.  He is  without symptoms today.   The history is provided by the patient.    Past Medical History  Diagnosis Date  . Bipolar affective disorder   . Anxiety   . 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 Alcohol abuse   . Cocaine abuse     Past Surgical History  Procedure Date  . Craniectomy     pt had surgery to remove blood clot from brain     No family history on file.  History  Substance Use Topics  . Smoking status: Unknown If Ever Smoked  . Smokeless tobacco: Not on file  . Alcohol Use: 10.8 oz/week    18 Cans of beer per week      Review of Systems  Constitutional: Negative for fever.  HENT: Negative for congestion, sore throat and neck pain.   Eyes: Negative.   Respiratory: Negative for chest tightness and shortness of breath.   Cardiovascular: Negative for chest pain.  Gastrointestinal: Negative for nausea and abdominal pain.  Genitourinary: Negative.   Musculoskeletal: Negative for joint swelling and arthralgias.  Skin: Negative.  Negative for rash and wound.  Neurological: Negative for dizziness, weakness, light-headedness, numbness and headaches.  Hematological: Negative.     Psychiatric/Behavioral: Negative for hallucinations and confusion. The patient is nervous/anxious.     Allergies  Hydrocodone  Home Medications   Current Outpatient Rx  Name Route Sig Dispense Refill  . ALPRAZOLAM 2 MG PO TABS Oral Take 1 tablet (2 mg total) by mouth 4 (four) times daily. 120 tablet 0  . DIAZEPAM 10 MG PO TABS Oral Take 1 tablet (10 mg total) by mouth every 6 (six) hours as needed. 120 tablet 0  . QUETIAPINE FUMARATE 50 MG PO TABS Oral Take 1 tablet (50 mg total) by mouth at bedtime. 30 tablet 0    BP 131/87  Pulse 65  Temp 97.8 F (36.6 C) (Oral)  Resp 18  Ht 5\' 11"  (1.803 m)  Wt 283 lb (128.368 kg)  BMI 39.47 kg/m2  SpO2 96%  Physical Exam  Nursing note and vitals reviewed. Constitutional: He is oriented to person, place, and time. He appears well-developed and well-nourished.  HENT:  Head: Normocephalic and atraumatic.  Cardiovascular: Normal rate.   Pulmonary/Chest: Effort normal.  Musculoskeletal: Normal range of motion.  Neurological: He is alert and oriented to person, place, and time. Coordination normal.  Skin: Skin is warm and dry.  Psychiatric: He has a normal mood and affect. His behavior is normal. Judgment and thought content normal.    ED Course  Procedures (including critical care time)  Labs Reviewed - No data to display No results found.   1. Medication refill  MDM  Pt was reviewed with state controlled substance database and he does get valium and xanax as indicated above.  Also verified with [facility] pharmacy that he does not have any refills remaining.  He was given a one month refill after conversation with and approval by Dr. Lynelle Doctor.  Reiterated with pt that this is a one time refill and he needs to keep appt with [facility] to maintain these medicines,  The ed will refill again.  Pt understands.        Burgess Amor, PA 04/23/12 1038

## 2012-04-26 ENCOUNTER — Encounter (HOSPITAL_COMMUNITY): Payer: Self-pay | Admitting: Emergency Medicine

## 2012-04-26 ENCOUNTER — Emergency Department (HOSPITAL_COMMUNITY)
Admission: EM | Admit: 2012-04-26 | Discharge: 2012-04-26 | Disposition: A | Discharge status: Home / Self Care | Payer: Medicaid Other | Attending: Emergency Medicine | Admitting: Emergency Medicine

## 2012-04-26 DIAGNOSIS — F319 Bipolar disorder, unspecified: Secondary | ICD-10-CM | POA: Insufficient documentation

## 2012-04-26 DIAGNOSIS — F411 Generalized anxiety disorder: Secondary | ICD-10-CM | POA: Insufficient documentation

## 2012-04-26 DIAGNOSIS — F1910 Other psychoactive substance abuse, uncomplicated: Secondary | ICD-10-CM | POA: Insufficient documentation

## 2012-04-26 DIAGNOSIS — F1010 Alcohol abuse, uncomplicated: Secondary | ICD-10-CM

## 2012-04-26 NOTE — ED Notes (Signed)
Pt presents after a syncopal eposide, pt smells of ETOH and states that he has been drinking tonight, Pt is slurring his speech at the moment. EMS states that he was found on the side of the road and the police had found him, pt requested to be evaluated. States he wants help with his drinking problem. States he also took xanex and valium that he is prescribed.

## 2012-04-26 NOTE — ED Notes (Signed)
Patient refusing blood work and any treatment. States that he called his father to come pick him up and that he is going home. Dr. Colon Branch notified that patient was refusing treatment. I informed patient that we cannot let him leave until his ride arrives.

## 2012-04-26 NOTE — ED Notes (Signed)
Pt denies any SI or HI. States he is worried bc he usually does not black out from drinking.

## 2012-04-26 NOTE — ED Provider Notes (Addendum)
History     CSN: [id number]  Arrival date & time 04/26/12  0330   First MD Initiated Contact with Patient 04/26/12 0350      Chief Complaint  Patient presents with  . Alcohol Intoxication    (Consider location/radiation/quality/duration/timing/severity/associated sxs/prior treatment) HPI Gary Stanley is a 41 y.o. male with a h/o polysubstance abuse, bipolar disorder brought in by ambulance, who presents to the Emergency Department complaining of passing out on the side of the road after [place]. He does not remember how he got to the roadside. He was found on the side of the road by police who called EMS to transport to the ER. The patient has no complaints. He admits to drinking. He admits to taking his home medicines of xanax and valium. He denies suicidal ideation or homicidal ideation. He is not hallucinating.  Past Medical History  Diagnosis Date  . Bipolar affective disorder   . Anxiety   . 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 Alcohol abuse   . Cocaine abuse     Past Surgical History  Procedure Date  . 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 No pertinent family history.  History  Substance Use Topics  . Smoking status: Unknown If Ever Smoked  . Smokeless tobacco: Not on file  . Alcohol Use: 10.8 oz/week    18 Cans of beer per week      Review of Systems  Constitutional: Negative for fever.       10 Systems reviewed and are negative for acute change except as noted in the HPI.  HENT: Negative for congestion.   Eyes: Negative for discharge and redness.  Respiratory: Negative for cough and shortness of breath.   Cardiovascular: Negative for chest pain.  Gastrointestinal: Negative for vomiting and abdominal pain.  Musculoskeletal: Negative for back pain.  Skin: Negative for rash.  Neurological: Negative for syncope, numbness and headaches.  Psychiatric/Behavioral:       No behavior change.     Allergies  Hydrocodone  Home Medications   Current Outpatient Rx  Name Route Sig Dispense Refill  . ALPRAZOLAM 2 MG PO TABS Oral Take 1 tablet (2 mg total) by mouth 4 (four) times daily. 120 tablet 0  . DIAZEPAM 10 MG PO TABS Oral Take 1 tablet (10 mg total) by mouth every 6 (six) hours as needed. 120 tablet 0  . QUETIAPINE FUMARATE 50 MG PO TABS Oral Take 1 tablet (50 mg total) by mouth at bedtime. 30 tablet 0    BP 132/81  Pulse 95  Temp 97.9 F (36.6 C) (Oral)  Resp 16  Ht 5\' 11"  (1.803 m)  Wt 280 lb (127.007 kg)  BMI 39.05 kg/m2  SpO2 94%  Physical Exam  Nursing note and vitals reviewed. Constitutional: He appears well-developed and well-nourished.       Awake, alert, nontoxic appearance.Intoxicated  HENT:  Head: Atraumatic.       Well healed scar to  Right scalp  Eyes: Right eye exhibits no discharge. Left eye exhibits no discharge.  Neck: Neck supple.  Cardiovascular: Normal rate and normal heart sounds.   Pulmonary/Chest: Effort normal and breath sounds normal. He exhibits no tenderness.  Abdominal: Soft. Bowel sounds are normal. There is no tenderness. There is no rebound.  Musculoskeletal: He exhibits no tenderness.       Baseline ROM, no obvious new focal weakness.  Neurological:  Mental status and motor strength appears baseline for patient and situation.  Skin: No rash noted.  Psychiatric: He has a normal mood and affect.    ED Course  Procedures (including critical care time)   Labs Reviewed  CBC  BASIC METABOLIC PANEL  URINALYSIS, ROUTINE W REFLEX MICROSCOPIC  URINE RAPID DRUG SCREEN (HOSP PERFORMED)  ETHANOL    [phone] Nursing advised that patient does not want any labs drawn or help with polysubstance abuse. He has called his father to pick him up.  I encouraged him to seek help with [facility].   MDM  Patient with a h/o polysubstance abuse who was found passed out on the side of the road after drinking, taking xanax and valium. PE is  unremarkable. Patient does not desire to remain in the ER for medical clearance and possible polysubstance abuse assistance.Patient's father picked him up and takes responsibility for his safety. Pt stable in ED with no significant deterioration in condition.The patient appears reasonably screened and/or stabilized for discharge and I doubt any other medical condition or other [facility] requiring further screening, evaluation, or treatment in the ED at this time prior to discharge.  MDM Reviewed: previous chart, nursing note and vitals Reviewed previous: labs and ECG Interpretation: labs           Nicoletta Dress. Colon Branch, MD 04/26/12 0502  Nicoletta Dress. Colon Branch, MD 04/26/12 9604

## 2012-04-27 ENCOUNTER — Encounter (HOSPITAL_COMMUNITY): Payer: Self-pay | Admitting: *Deleted

## 2012-04-27 ENCOUNTER — Encounter (HOSPITAL_COMMUNITY): Payer: Self-pay | Admitting: Emergency Medicine

## 2012-04-27 ENCOUNTER — Emergency Department (HOSPITAL_COMMUNITY)
Admission: EM | Admit: 2012-04-27 | Discharge: 2012-04-27 | Disposition: A | Discharge status: Home / Self Care | Payer: Medicaid Other | Attending: Emergency Medicine | Admitting: Emergency Medicine

## 2012-04-27 ENCOUNTER — Inpatient Hospital Stay (HOSPITAL_COMMUNITY)
Admission: AD | Admit: 2012-04-27 | Discharge: 2012-04-30 | DRG: 897 | Disposition: A | Discharge status: Home / Self Care | Payer: 59 | Attending: Psychiatry | Admitting: Psychiatry

## 2012-04-27 DIAGNOSIS — F1320 Sedative, hypnotic or anxiolytic dependence, uncomplicated: Secondary | ICD-10-CM | POA: Diagnosis present

## 2012-04-27 DIAGNOSIS — Z79899 Other long term (current) drug therapy: Secondary | ICD-10-CM | POA: Insufficient documentation

## 2012-04-27 DIAGNOSIS — F4310 Post-traumatic stress disorder, unspecified: Secondary | ICD-10-CM | POA: Diagnosis present

## 2012-04-27 DIAGNOSIS — S069XAS Unspecified intracranial injury with loss of consciousness status unknown, sequela: Secondary | ICD-10-CM

## 2012-04-27 DIAGNOSIS — F1020 Alcohol dependence, uncomplicated: Secondary | ICD-10-CM | POA: Diagnosis present

## 2012-04-27 DIAGNOSIS — F1410 Cocaine abuse, uncomplicated: Secondary | ICD-10-CM | POA: Insufficient documentation

## 2012-04-27 DIAGNOSIS — F1010 Alcohol abuse, uncomplicated: Secondary | ICD-10-CM | POA: Insufficient documentation

## 2012-04-27 DIAGNOSIS — F1910 Other psychoactive substance abuse, uncomplicated: Secondary | ICD-10-CM

## 2012-04-27 DIAGNOSIS — X58XXXS Exposure to other specified factors, sequela: Secondary | ICD-10-CM

## 2012-04-27 DIAGNOSIS — T424X5A Adverse effect of benzodiazepines, initial encounter: Secondary | ICD-10-CM | POA: Diagnosis present

## 2012-04-27 DIAGNOSIS — S069X9S Unspecified intracranial injury with loss of consciousness of unspecified duration, sequela: Secondary | ICD-10-CM

## 2012-04-27 DIAGNOSIS — F319 Bipolar disorder, unspecified: Secondary | ICD-10-CM | POA: Insufficient documentation

## 2012-04-27 DIAGNOSIS — R413 Other amnesia: Secondary | ICD-10-CM | POA: Diagnosis present

## 2012-04-27 DIAGNOSIS — F1920 Other psychoactive substance dependence, uncomplicated: Principal | ICD-10-CM | POA: Diagnosis present

## 2012-04-27 LAB — BASIC METABOLIC PANEL
Calcium: 9.2 mg/dL (ref 8.4–10.5)
Creatinine, Ser: 1.15 mg/dL (ref 0.50–1.35)
GFR calc Af Amer: >90 mL/min (ref >90)
GFR calc non Af Amer: 79 mL/min — ABNORMAL LOW (ref >90)
Sodium: 134 mEq/L — ABNORMAL LOW (ref 135–145)

## 2012-04-27 LAB — CBC
MCH: 33.5 pg (ref 26.0–34.0)
MCHC: 35.8 g/dL (ref 30.0–36.0)
MCV: 93.5 fL (ref 78.0–100.0)
Platelets: 264 10*3/uL (ref 150–400)
RBC: 4.96 MIL/uL (ref 4.22–5.81)
RDW: 13.2 % (ref 11.5–15.5)

## 2012-04-27 LAB — ETHANOL: Alcohol, Ethyl (B): 98 mg/dL — ABNORMAL HIGH (ref 0–11)

## 2012-04-27 LAB — RAPID URINE DRUG SCREEN, HOSP PERFORMED: Opiates: NOT DETECTED

## 2012-04-27 LAB — TROPONIN I: Troponin I: <0.3 ng/mL (ref <0.30)

## 2012-04-27 MED ORDER — ADULT MULTIVITAMIN W/MINERALS CH
1.0000 | ORAL_TABLET | Freq: Every day | ORAL | Status: DC
  Administered 2012-04-27 – 2012-04-30 (×4): 1 via ORAL
  Filled 2012-04-27 (×5): qty 1

## 2012-04-27 MED ORDER — VITAMIN B-1 100 MG PO TABS
100.0000 mg | ORAL_TABLET | Freq: Every day | ORAL | Status: DC
  Administered 2012-04-28 – 2012-04-30 (×3): 100 mg via ORAL
  Filled 2012-04-27 (×4): qty 1

## 2012-04-27 MED ORDER — MAGNESIUM HYDROXIDE 400 MG/5ML PO SUSP: 30.0000 mL | Freq: Every day | ORAL | Status: DC | PRN

## 2012-04-27 MED ORDER — CHLORDIAZEPOXIDE HCL 25 MG PO CAPS
25.0000 mg | ORAL_CAPSULE | Freq: Four times a day (QID) | ORAL | Status: DC | PRN
  Administered 2012-04-28: 25 mg via ORAL
  Filled 2012-04-27: qty 1

## 2012-04-27 MED ORDER — ACETAMINOPHEN 325 MG PO TABS: 650.0000 mg | ORAL_TABLET | Freq: Four times a day (QID) | ORAL | Status: DC | PRN

## 2012-04-27 MED ORDER — CHLORDIAZEPOXIDE HCL 25 MG PO CAPS
50.0000 mg | ORAL_CAPSULE | Freq: Once | ORAL | Status: AC
  Administered 2012-04-27: 50 mg via ORAL
  Filled 2012-04-27: qty 2

## 2012-04-27 MED ORDER — HYDROXYZINE HCL 25 MG PO TABS
25.0000 mg | ORAL_TABLET | Freq: Four times a day (QID) | ORAL | Status: DC | PRN
  Administered 2012-04-27: 25 mg via ORAL

## 2012-04-27 MED ORDER — ONDANSETRON 4 MG PO TBDP
4.0000 mg | ORAL_TABLET | Freq: Four times a day (QID) | ORAL | Status: DC | PRN
  Administered 2012-04-29 – 2012-04-30 (×2): 4 mg via ORAL
  Filled 2012-04-27: qty 1

## 2012-04-27 MED ORDER — LOPERAMIDE HCL 2 MG PO CAPS
2.0000 mg | ORAL_CAPSULE | ORAL | Status: DC | PRN
  Administered 2012-04-28: 4 mg via ORAL

## 2012-04-27 MED ORDER — THIAMINE HCL 100 MG/ML IJ SOLN: 100.0000 mg | Freq: Once | INTRAMUSCULAR | Status: DC

## 2012-04-27 MED ORDER — ALUM & MAG HYDROXIDE-SIMETH 200-200-20 MG/5ML PO SUSP: 30.0000 mL | ORAL | Status: DC | PRN

## 2012-04-27 MED ORDER — POTASSIUM CHLORIDE CRYS ER 20 MEQ PO TBCR
40.0000 meq | EXTENDED_RELEASE_TABLET | Freq: Once | ORAL | Status: AC
  Administered 2012-04-27: 40 meq via ORAL
  Filled 2012-04-27: qty 2

## 2012-04-27 NOTE — ED Notes (Signed)
Pt asleep but arouses easily to verbal stimuli. Pt c/o feeling jittery when awakened. Pt states that he wants to get some help for his problems. States that he has been on Xanax, Valium and taking drugs. States that he wants to go to rehab. Denies suicidal or homicidal ideations.

## 2012-04-27 NOTE — ED Notes (Signed)
Patient is unable to provide a urine sample at this time.

## 2012-04-27 NOTE — ED Notes (Signed)
Patient came out of room and stated he wanted to go to the bathroom to try collect some urine. Patient's gait was slow but steady. No assistance needed. Patient returned back to room stating he was unable to void.

## 2012-04-27 NOTE — ED Provider Notes (Signed)
History     CSN: [id number]  Arrival date & time 04/27/12  0141   First MD Initiated Contact with Patient 04/27/12 0149      Chief Complaint  Patient presents with  . Loss of Consciousness    (Consider location/radiation/quality/duration/timing/severity/associated sxs/prior treatment) HPI  Gary Stanley is a 41 y.o. male  With a h/o bipolar disorder, polysubstance brought in by ambulance, who presents to the Emergency Department complaining of needing help with alcohol and cocaine  use. States he has been through detox before at [facility] and [facility] in the past. Recently broke up with a girlfriend and started drinking heavily. Has been having blackouts. Tonight he went to a crack house and when he "woke up" he had lost  $800. He does not remember if he did cocaine. He was seen in the ER last night having passed out along the side of road after drinking heavily. He did not stay to complete evaluation.He had his father come pick him up. He denies suicidal ideation, homicidal ideation, hallucinations.   Past Medical History  Diagnosis Date  . Bipolar affective disorder   . Anxiety   . 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 Alcohol abuse   . Cocaine abuse     Past Surgical History  Procedure Date  . Craniectomy     pt had surgery to remove blood clot from brain     No family history on file.  History  Substance Use Topics  . Smoking status: Unknown If Ever Smoked  . Smokeless tobacco: Not on file  . Alcohol Use: 10.8 oz/week    18 Cans of beer per week      Review of Systems  Constitutional: Negative for fever.       10 Systems reviewed and are negative for acute change except as noted in the HPI.  HENT: Negative for congestion.   Eyes: Negative for discharge and redness.  Respiratory: Negative for cough and shortness of breath.   Cardiovascular: Negative for chest pain.  Gastrointestinal: Negative for vomiting and abdominal pain.  Musculoskeletal: Negative for back pain.  Skin:  Negative for rash.  Neurological: Negative for syncope, numbness and headaches.  Psychiatric/Behavioral:       No behavior change.    Allergies  Hydrocodone  Home Medications   Current Outpatient Rx  Name Route Sig Dispense Refill  . ALPRAZOLAM 2 MG PO TABS Oral Take 1 tablet (2 mg total) by mouth 4 (four) times daily. 120 tablet 0  . DIAZEPAM 10 MG PO TABS Oral Take 1 tablet (10 mg total) by mouth every 6 (six) hours as needed. 120 tablet 0  . QUETIAPINE FUMARATE 50 MG PO TABS Oral Take 1 tablet (50 mg total) by mouth at bedtime. 30 tablet 0    BP 116/72  Pulse 100  Temp 98.3 F (36.8 C) (Oral)  Resp 16  Ht 5\' 11"  (1.803 m)  Wt 280 lb (127.007 kg)  BMI 39.05 kg/m2  SpO2 95%  Physical Exam  Nursing note and vitals reviewed. Constitutional: He is oriented to person, place, and time. He appears well-developed and well-nourished.       Awake, alert, nontoxic appearance.  HENT:  Head: Normocephalic.       Healed scar to right side of scalp.  Eyes: Conjunctivae and EOM are normal. Pupils are equal, round, and reactive to light. Right eye exhibits no discharge. Left eye exhibits no discharge.  Neck: Normal range of motion. Neck supple.  Cardiovascular: Normal rate and  normal heart sounds.   Pulmonary/Chest: Effort normal and breath sounds normal. He exhibits no tenderness.  Abdominal: Soft. Bowel sounds are normal. There is no tenderness. There is no rebound.  Musculoskeletal: Normal range of motion. He exhibits no tenderness.       Baseline ROM, no obvious new focal weakness.  Neurological: He is alert and oriented to person, place, and time.       Mental status and motor strength appears baseline for patient and situation.  Skin: No rash noted.  Psychiatric: He has a normal mood and affect.    ED Course  Procedures (including critical care time)  Results for orders placed during the hospital encounter of 04/27/12  CBC      Component Value Range   WBC 8.2  4.0 - 10.5  K/uL   RBC 4.96  4.22 - 5.81 MIL/uL   Hemoglobin 16.6  13.0 - 17.0 g/dL   HCT 16.1  09.6 - 04.5 %   MCV 93.5  78.0 - 100.0 fL   MCH 33.5  26.0 - 34.0 pg   MCHC 35.8  30.0 - 36.0 g/dL   RDW 40.9  81.1 - 91.4 %   Platelets 264  150 - 400 K/uL  BASIC METABOLIC PANEL      Component Value Range   Sodium 134 (*) 135 - 145 mEq/L   Potassium 3.3 (*) 3.5 - 5.1 mEq/L   Chloride 97  96 - 112 mEq/L   CO2 22  19 - 32 mEq/L   Glucose, Bld 117 (*) 70 - 99 mg/dL   BUN 12  6 - 23 mg/dL   Creatinine, Ser 7.82  0.50 - 1.35 mg/dL   Calcium 9.2  8.4 - 95.6 mg/dL   GFR calc non Af Amer 79 (*) >90 mL/min   GFR calc Af Amer >90  >90 mL/min  TROPONIN I      Component Value Range   Troponin I <0.30  <0.30 ng/mL  URINE RAPID DRUG SCREEN (HOSP PERFORMED)      Component Value Range   Opiates NONE DETECTED  NONE DETECTED   Cocaine POSITIVE (*) NONE DETECTED   Benzodiazepines POSITIVE (*) NONE DETECTED   Amphetamines NONE DETECTED  NONE DETECTED   Tetrahydrocannabinol NONE DETECTED  NONE DETECTED   Barbiturates NONE DETECTED  NONE DETECTED  ETHANOL      Component Value Range   Alcohol, Ethyl (B) 98 (*) 0 - 11 mg/dL     Date: 21/30/8657  8469  Rate: 99  Rhythm: normal sinus rhythm  QRS Axis: normal  Intervals: QT prolonged  ST/T Wave abnormalities: nonspecific T wave changes  Conduction Disutrbances:IRBB  Narrative Interpretation:   Old EKG Reviewed: changes noted c/w 02/28/12 QT prolonged  0612 Spoke with Felissa, ACT. She will see/evaluate patient.  MDM  Patient with a h/o polysubstance abuse asking for detox. EKG is normal, UDS positive for cocaine and benzodiazepines, ETOH 98. Potassium slightly low and repleted. He is medically cleared for evaluation. Will have ACT see/evaluate patient.  MDM Reviewed: nursing note and vitals Interpretation: labs Consults: ACT.           Nicoletta Dress. Colon Branch, MD 04/27/12 [phone]

## 2012-04-27 NOTE — Progress Notes (Addendum)
Patient ID: Gary Stanley, male   DOB: 10/11/1971, 41 y.o.   MRN: [id number] 04-27-12 @ 1645 nursing adm note: pt came to [facility] voluntarily with an admitting dx of bipolar, depressed, etoh dependence and cocaine abuse. Pt stated he is a binge drinker and a binge cocaine user/abuser. Pt stated he had a blackout which caused him to lose over $ 700.00 dollars of his money. He drank 1/5 of liquor and used crack/cocaine. His ciwa was 6 and his last use of both was yesterday. He denied any si/hi/av. His only medical issue was a tbi in 03-23-1995. He has no allergies, is a non smoker, has no pain and has never had a seizure. This pt has some legal issues, is on probation and has a Engineer, drilling named bert brewer in [place] at [phone]. His labs are: uds positive for benzos and cocaine, etoh 98 high, bmet abnormal; cbc wnl and troponin I wnl. Made rn aware that v/s need to be entered into the system that were taken by the mht.  Pt was escorted to the 500 hall and report was given to luanne, rn.    Contact person: arlan Pennie- father

## 2012-04-27 NOTE — ED Notes (Signed)
Patient stated to Fannie Knee NT "someone from here took my mace the day before when I was here. Can you see if they have it." Fannie Knee NT advised security and they denied having any mace in storage. Advised patient what security said and he verbalized understanding.

## 2012-04-27 NOTE — BH Assessment (Signed)
[facility] Assessment Progress Note      04/27/2012 Tim stated that the patient was accepted by [facility] to Dr. Dan Humphreys. Patient will be going to bed 501-1. Patient is voluntary and will be referred to [facility] for transfer. Completed support paperwork. Contacted [facility] and received authorization from [place] for 3 days, from 04/27/12 to 04/29/2012;  auth number is [id number].   Shon Baton, MSW, LCSW, LCASA, CSW-G

## 2012-04-27 NOTE — Progress Notes (Signed)
12:04 PM Pt accepted for transfer to [facility] by Dr. Dan Humphreys.

## 2012-04-27 NOTE — ED Notes (Signed)
Pt given crackers, peanut butter and Coke. C/o being hungry. Waiting for ACT team. Lying comfortably on stretcher. Denies suicidal or homicidal ideations.

## 2012-04-27 NOTE — ED Notes (Signed)
Felissa from ACT team will be here shortly to evaluate patient.

## 2012-04-27 NOTE — BH Assessment (Signed)
Assessment Note   Gary Stanley is an 41 y.o. male. He has returned to the Emergency room due to a black out which caused him to lose over $700 of his money. He states he drank approximately a 1/5 of liquor all day yesterday. He then wanted to use crack cocaine and apparently blacked out as he doesn't remember anything from the time he went to use drugs. He states that he is scared because he hasn't had black outs before and knows he needs to get help now before something worse happens. He states that he had a 96 year old nephew who died of an overdose a couple of years ago, who was having a blackouts and died after taking too much. He says he is feeling hopeless and helpless; crying spells at times and overall guilt and shame. He denies Suicidal or homicidal ideation; no delusions or hallucinations noted. He reports his medications do not appear to be working as well, and he has an appointment to see a psychiatrist at [facility] in [facility] May 22, 2012. He states he takes Seroquel 50 mg; Xanax 2 mg QID and Valium 10 mg QID. He states that he can't think and doesn't feel this is the right medication "cocktail" for him. He says he doesn't want to feel over medicated. He reports he sleeps well, but only because of taking the xanax and the valium. He denies use of any other drugs at this time.   Axis I: Bipolar, Depressed; Alcohol Dependence; Cocaine Abuse Axis II: Deferred Axis III Craniectomy by hx Axis IV: Significant social issues, including relationship issues; financial issues directly related to substance abuse Axis V: GAF 28   LOCUS 31  Past Medical History:  Past Medical History  Diagnosis Date  . Bipolar affective disorder   . Anxiety   . 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 Alcohol abuse   . Cocaine abuse     Past Surgical History  Procedure Date  . Craniectomy     pt had surgery to remove blood clot from brain     Family History: No family history on file.  Social History:  has an unknown  smoking status. He does not have any smokeless tobacco history on file. He reports that he drinks about 10.8 ounces of alcohol per week. He reports that he uses illicit drugs ("Crack" cocaine and Cocaine) about 3 times per week.  Additional Social History:     CIWA: CIWA-Ar BP: 114/58 mmHg Pulse Rate: 90  COWS:    Allergies:  Allergies  Allergen Reactions  . Hydrocodone Itching and Nausea Only    Home Medications:  (Not in a hospital admission)  OB/GYN Status:  No LMP for male patient.  General Assessment Data Location of Assessment: [facility] ACT Assessment: Yes Living Arrangements: Parent Can pt return to current living arrangement?: Yes Admission Status: Voluntary Is patient capable of signing voluntary admission?: Yes Transfer from: Acute Hospital Referral Source: MD  Education Status Is patient currently in school?: No  Risk to self Suicidal Ideation: No Suicidal Intent: No Is patient at risk for suicide?: No Suicidal Plan?: No Access to Means: No What has been your use of drugs/alcohol within the last 12 months?:  (daily) Previous Attempts/Gestures: No Intentional Self Injurious Behavior: None Family Suicide History: Yes (nephew OD'd on opiates) Recent stressful life event(s): Financial Problems Persecutory voices/beliefs?: No Depression: Yes Depression Symptoms: Tearfulness;Isolating;Loss of interest in usual pleasures;Feeling worthless/self pity Substance abuse history and/or treatment for substance abuse?: Yes Suicide prevention  information given to non-admitted patients: Not applicable  Risk to Others Homicidal Ideation: No Thoughts of Harm to Others: No Current Homicidal Intent: No Current Homicidal Plan: No Access to Homicidal Means: No History of harm to others?: No Assessment of Violence: None Noted Does patient have access to weapons?: No Criminal Charges Pending?: No Does patient have a court date: No  Psychosis Hallucinations: None  noted Delusions: None noted  Mental Status Report Appear/Hygiene: Disheveled Eye Contact: Fair Motor Activity: Freedom of movement Speech: Logical/coherent;Soft Level of Consciousness: Alert;Quiet/awake Mood: Depressed;Helpless;Worthless, low self-esteem Affect: Appropriate to circumstance;Blunted;Depressed Anxiety Level: Minimal Thought Processes: Relevant Judgement: Unimpaired Orientation: Person;Place;Time;Situation;Appropriate for developmental age Obsessive Compulsive Thoughts/Behaviors: Minimal  Cognitive Functioning Concentration: Decreased Memory: Remote Intact;Recent Impaired IQ: Average Insight: Poor Impulse Control: Poor Appetite: Fair Sleep: No Change Total Hours of Sleep:  (7)  ADLScreening [facility]) Patient's cognitive ability adequate to safely complete daily activities?: Yes Patient able to express need for assistance with ADLs?: Yes Independently performs ADLs?: Yes  Abuse/Neglect [facility]) Physical Abuse: Denies Verbal Abuse: Denies Sexual Abuse: Denies  Prior Inpatient Therapy Prior Inpatient Therapy: Yes Prior Therapy Dates:  (2012, 2011, 2009) Prior Therapy Facilty/Provider(s):  ([facility], [facility], [facility], [facility]?) Reason for Treatment:  (SA, depression)  Prior Outpatient Therapy Prior Outpatient Therapy: Yes Prior Therapy Dates:  (2012) Prior Therapy Facilty/Provider(s):  (Dr. Tiburcio Pea) Reason for Treatment:  (SA, Bipolar D/O )  ADL Screening (condition at time of admission) Patient's cognitive ability adequate to safely complete daily activities?: Yes Patient able to express need for assistance with ADLs?: Yes Independently performs ADLs?: Yes       Abuse/Neglect Assessment (Assessment to be complete while patient is alone) Physical Abuse: Denies Verbal Abuse: Denies Sexual Abuse: Denies Values / Beliefs Cultural Requests During Hospitalization: None Spiritual Requests During Hospitalization: None        Additional  Information 1:1 In Past 12 Months?: No CIRT Risk: No Elopement Risk: No Does patient have medical clearance?: Yes     Disposition:  Disposition Disposition of Patient: Inpatient treatment program Type of inpatient treatment program: Adult  Patient will be referred to [facility]; [facility], [facility] and [facility] for dual diagnosis treatment. He will need an alcohol detox and a medication evaluation due to increased depressive issues and the patient's report of feeling over medicated.   On Site Evaluation by:  Dr. Colon Branch Reviewed with Physician:  Dr. Laruth Bouchard, Maximiano Coss H 04/27/2012 10:40 AM

## 2012-04-27 NOTE — ED Notes (Signed)
Pt waiting for ACT team to evaluate patient. Remains asleep on stretcher.

## 2012-04-27 NOTE — Tx Team (Signed)
Initial Interdisciplinary Treatment Plan  PATIENT STRENGTHS: (choose at least two) Ability for insight Capable of independent living Motivation for treatment/growth Supportive family/friends Work skills  PATIENT STRESSORS: Health problems Legal issue Substance abuse   PROBLEM LIST: Problem List/Patient Goals Date to be addressed Date deferred Reason deferred Estimated date of resolution  Substance abuse/dependence  04-27-12      Bipolar d/o  04-27-12           Legal issues  04-27-12                                     DISCHARGE CRITERIA:  Improved stabilization in mood, thinking, and/or behavior Withdrawal symptoms are absent or subacute and managed without 24-hour nursing intervention  PRELIMINARY DISCHARGE PLAN: Attend aftercare/continuing care group Attend 12-step recovery group Return to previous living arrangement  PATIENT/FAMIILY INVOLVEMENT: This treatment plan has been presented to and reviewed with the patient, Gary Stanley, and/or family member, .  The patient and family have been given the opportunity to ask questions and make suggestions.  [name] 04/27/2012, 4:46 PM

## 2012-04-27 NOTE — ED Notes (Signed)
Report given to [facility] with [facility], ETA of 15-20 minutes

## 2012-04-27 NOTE — ED Notes (Addendum)
Patient states his PCP put him on 2mg  Xanax, 10mg  Valium, and 50mg  seroguel and he has been "blacking out for the past week." Patient states "when I blacked out tonight I went to a crack house and had $800 and when I woke up I had $7. I need help." Patient was here last night for same. Admits to alcohol intake "all day today" and cocaine use.

## 2012-04-27 NOTE — Progress Notes (Signed)
      Psychoeducational Group Note  Date:  04/27/2012 Time: 0900  Group Topic/Focus:  AA Meeting  Participation Level:  Did not attend  Additional Comments: Pt was sleeping after being admitted today and pleasantly asked if he could skip this time.    Shelah Lewandowsky 04/27/2012, 9:46 PM

## 2012-04-27 NOTE — ED Notes (Signed)
ACT team member here to assess patient. No acute distress.

## 2012-04-27 NOTE — ED Notes (Signed)
Patient has been up out of bed to restroom several times, however, he states that he is unable to provide a urine sample, MD aware.

## 2012-04-27 NOTE — ED Notes (Signed)
Gave patient sprite as requested and approved by MD. 

## 2012-04-28 DIAGNOSIS — R413 Other amnesia: Secondary | ICD-10-CM

## 2012-04-28 DIAGNOSIS — F1910 Other psychoactive substance abuse, uncomplicated: Secondary | ICD-10-CM

## 2012-04-28 MED ORDER — GABAPENTIN 100 MG PO CAPS
100.0000 mg | ORAL_CAPSULE | Freq: Three times a day (TID) | ORAL | Status: DC
  Administered 2012-04-28 – 2012-04-30 (×7): 100 mg via ORAL
  Filled 2012-04-28 (×9): qty 1

## 2012-04-28 MED ORDER — CARBAMAZEPINE 200 MG PO TABS
200.0000 mg | ORAL_TABLET | Freq: Three times a day (TID) | ORAL | Status: DC
  Administered 2012-04-28 – 2012-04-30 (×6): 200 mg via ORAL
  Filled 2012-04-28 (×9): qty 1

## 2012-04-28 MED ORDER — CARBAMAZEPINE ER 200 MG PO TB12: 200.0000 mg | ORAL_TABLET | Freq: Two times a day (BID) | ORAL | Status: DC

## 2012-04-28 NOTE — Progress Notes (Signed)
Adult Psychosocial Assessment Update Interdisciplinary Team  [facility] admissions/discharges:  Admissions Discharges  Date:   01/02/12 Date:   01/03/12  Date: Date:  Date: Date:  Date: Date:  Date: Date:   Changes since the last Psychosocial Assessment (including adherence to outpatient mental health and/or substance abuse treatment, situational issues contributing to decompensation and/or relapse). Taryn reports that he did not stop drinking after last admission, but that his drinking was  not as bad it is now. Started drinking more heavily after girlfriend broke up with him, and   then blacked out. He came to in a "crack house" with $793 less than he had before he   blacked out, which he assumes he used on cocaine. Very concerned since this is the  first time he has blacked out but had a family member who died from an overdose after  Passing out. On house arrest as well.   Discharge Plan 1. Will you be returning to the same living situation after discharge?   Yes:    X No:      If no, what is your plan?    Has a home he can return to        2. Would you like a referral for services when you are discharged? Yes:     If yes, for what services?  No:   X    Has an appointment to begin OPT with [facility] on August 1.       Summary and Recommendations (to be completed by the evaluator) Lamarius is a 41 year old male diagnosed with Bipolar Disorder, Alcohol Dependence  and Cocaine Abuse. He reports that he came to the hospital for syncope on one day and  refused treatment, then came back the next day. Reports he has never blacked out  before and this is bothering him.  [place] would benefit from crisis stabilization,   medication evaluation, therapy groups for processing thoughts/feelings/experiences,   psychoed groups for coping skills and case management for discharge planning.              Signature:  Billie Lade, 04/28/2012  10:24 AM

## 2012-04-28 NOTE — Progress Notes (Signed)
Pt did not attend d/c planning group on this date.  SW met with pt individually at this time.  Pt presents with anxious mood and affect.  Pt reports being too anxious to attend any groups at this time.  Pt states that he has been off of his meds for a few days now.  Pt states that he was on probation and house arrest for a DWI.  Pt states that he has been blacking out and doesn't know why.  Pt then states that he woke up in a drug house, his ankle bracelet was off and he had drank alcohol and cocaine after not using for awhile.  Pt states that his probation officer is aware of what happened and he was sent to [facility].  Pt states that he wants to get stable on his meds for anxiety.  Pt rates depression at an 8 and anxiety at a 10 today.  Pt denies SI.  Pt states that he follows up with [facility] for medication management and therapy.  SW will refer pt back to [facility].  No further needs voiced by pt at this time.    Reyes Ivan, LCSWA 04/28/2012  12:50 PM

## 2012-04-28 NOTE — H&P (Signed)
Medical/psychiatric screening examination/treatment/procedure(s) were performed by non-physician practitioner and as supervising physician I was immediately available for consultation/collaboration.  I have seen and examined this patient and agree the major elements of this evaluation.  

## 2012-04-28 NOTE — Progress Notes (Signed)
D: Pt mood is depressed and affect is flat. Pt says he did not sleep well last night and has been sleeping off and on throughout this shift. Pt reports a poor energy level with a depression level of 8 and hopelessness level of 4. Pt complained of diarrhea, cravings, and this nurse observed tremors when arms are extended. Pt reports feeling anxious at this time and does not want to attend group because of this. Pt states " I want to know what is causing my blackouts".  A: Support given. Verbalization encouraged. Meds given as ordered. PRN librium given for withdrawal symptoms. PRN Imodium given for diarrhea. Pt encouraged to participate in group and interact with peers.  R. Pt is receptive but continues to be withdrawn. No complaints of pain or discomfort at this time. Will continue to monitor pt.

## 2012-04-28 NOTE — Progress Notes (Signed)
Patient ID: Gary Stanley, male   DOB: [birth year], 41 y.o.   MRN: [id number]  Problem: Polysubstance abuse  D: Pt isolative to his room sleeping much of shift. Pt did not interact with peers.   A: Monitor q 15 minutes for safety, encourage staff/peer interaction and group attendance. Administer medications as ordered by MD.  R: Pt remains withdrawn and isolative with dull, flat affect. Pt endorses depression but denies SI or plans to harm himself. Pt declined to go to group stating he was too sleepy. No inappropriate behaviors noted during shift.

## 2012-04-28 NOTE — Progress Notes (Signed)
Psychoeducational Group Note  Date:  04/28/2012 Time:  1100  Group Topic/Focus:  Wellness Toolbox:   The focus of this group is to discuss various aspects of wellness, balancing those aspects and exploring ways to increase the ability to experience wellness.  Patients will create a wellness toolbox for use upon discharge.  Participation Level:  Did Not Attend  Participation Quality:    Affect:    Cognitive:    Insight:    Engagement in Group:    Additional Comments:  Came in the early morning  Quinlee Sciarra Celcia 04/28/2012, 1:58 PM
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
## 2012-04-28 NOTE — Therapy (Signed)
Psychoeducational Group Note  Date:  04/28/2012 Time:  2010  Group Topic/Focus:  Wrap-Up Group:   The focus of this group is to help patients review their daily goal of treatment and discuss progress on daily workbooks.  Participation Level:  Active  Participation Quality:  Attentive  Affect:  Appropriate  Cognitive:  Appropriate  Insight:  Good  Engagement in Group:  Limited  Additional Comments:  Patient attended group this evening.  Damaria Stofko, [place] Pigg 04/28/2012, 9:37 PM

## 2012-04-28 NOTE — Tx Team (Signed)
Interdisciplinary Treatment Plan Update (Adult)  Date:  04/28/2012  Time Reviewed:  11:05 AM   Progress in Treatment: Attending groups: Yes Participating in groups:  Yes Taking medication as prescribed: Yes Tolerating medication:  Yes Family/Significant other contact made:  Counselor will assess for appropriate contact Patient understands diagnosis:  Yes Discussing patient identified problems/goals with staff:  Yes Medical problems stabilized or resolved:  Yes Denies suicidal/homicidal ideation: Yes Issues/concerns per patient self-inventory:  None identified Other: N/A  New problem(s) identified: None Identified  Reason for Continuation of Hospitalization: Anxiety Depression Medication stabilization  Interventions implemented related to continuation of hospitalization: mood stabilization, medication monitoring and adjustment, group therapy and psycho education, safety checks q 15 mins  Additional comments: N/A  Estimated length of stay: 3-5 days  Discharge Plan: SW will assess for appropriate referrals.    New goal(s): N/A  Review of initial/current patient goals per problem list:    1.  Goal(s): Reduce depressive symptoms  Met:  No  Target date: by discharge  As evidenced by: Reducing depression from a 10 to a 3 as reported by pt.   2.  Goal (s): Reduce/Eliminate suicidal ideation  Met:  No  Target date: by discharge  As evidenced by: pt reporting no SI.    3.  Goal(s): Reduce anxiety symptoms  Met:  No  Target date: by discharge  As evidenced by: Reduce anxiety from a 10 to a 3 as reported by pt.    Attendees: Patient:  Gary Stanley 04/28/2012 11:06 AM   Family:     Physician:  Orson Aloe, MD  04/28/2012  11:05 AM   Nursing:   Barrie Folk, RN 04/28/2012 11:06 AM   Case Manager:  Reyes Ivan, LCSWA 04/28/2012  11:05 AM   Counselor:  Angus Palms, LCSW 04/28/2012  11:05 AM   Other:  Neill Loft, RN 04/28/2012  11:05 AM   Other:  Clarice Pole,  LCASA 04/28/2012 11:06 AM   Other:     Other:      Scribe for Treatment Team:   Carmina Miller, 04/28/2012 , 11:05 AM
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
## 2012-04-28 NOTE — Progress Notes (Signed)
[facility] MD Progress Note  04/28/2012 1:46 PM  Diagnosis:   Axis I: Polysubstance Abuse, rule out chronic traumatic encephalopathy Axis II: Deferred Axis III:  Past Medical History  Diagnosis Date  . Bipolar affective disorder   . Anxiety   . 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 Alcohol abuse   . Cocaine abuse    Axis IV: other psychosocial or environmental problems Axis V: 41-50 serious symptoms  ADL's:  Intact  Sleep: Poor  Appetite:  Fair  Suicidal Ideation:  Pt denies any thoughts, plans, intent of suicide Homicidal Ideation:  Pt denies any thoughts, plans, intent of homicide  Mental Status Examination/Evaluation: Objective:  Appearance: Casual  Eye Contact::  Good  Speech:  Clear and Coherent  Volume:  Normal  Mood:  Anxious, Depressed and Hopeless  Affect:  Congruent  Thought Process:  Coherent  Orientation:  Full  Thought Content:  WDL  Suicidal Thoughts:  No  Homicidal Thoughts:  No  Memory:  Immediate;   Fair Recent;   Fair Remote;   Fair  Judgement:  Fair  Insight:  Fair  Psychomotor Activity:  Normal  Concentration:  Fair  Recall:  Fair  Akathisia:  No  Handed:  Right  AIMS (if indicated):     Assets:  Communication Skills Desire for Improvement  Sleep:  Number of Hours: 5.25    Vital Signs:Blood pressure 109/72, pulse 86, temperature 97.1 F (36.2 C), temperature source Oral, resp. rate 20, height 5' 8.5" (1.74 m), weight 126.554 kg (279 lb). Current Medications: Current Facility-Administered Medications  Medication Dose Route Frequency Provider Last Rate Last Dose  . acetaminophen (TYLENOL) tablet 650 mg  650 mg Oral Q6H PRN Verne Spurr, PA-C      . alum & mag hydroxide-simeth (MAALOX/MYLANTA) 200-200-20 MG/5ML suspension 30 mL  30 mL Oral Q4H PRN Verne Spurr, PA-C      . carbamazepine (TEGRETOL) tablet 200 mg  200 mg Oral TID Mike Craze, MD   200 mg at 04/28/12 1203  . chlordiazePOXIDE (LIBRIUM) capsule 25 mg  25 mg Oral Q6H PRN Verne Spurr, PA-C   25  mg at 04/28/12 0846  . chlordiazePOXIDE (LIBRIUM) capsule 50 mg  50 mg Oral Once [employer], PA-C   50 mg at 04/27/12 1509  . gabapentin (NEURONTIN) capsule 100 mg  100 mg Oral TID Mike Craze, MD   100 mg at 04/28/12 1203  . hydrOXYzine (ATARAX/VISTARIL) tablet 25 mg  25 mg Oral Q6H PRN Verne Spurr, PA-C   25 mg at 04/27/12 2337  . loperamide (IMODIUM) capsule 2-4 mg  2-4 mg Oral PRN Verne Spurr, PA-C   4 mg at 04/28/12 0901  . magnesium hydroxide (MILK OF MAGNESIA) suspension 30 mL  30 mL Oral Daily PRN Verne Spurr, PA-C      . multivitamin with minerals tablet 1 tablet  1 tablet Oral Daily Verne Spurr, PA-C   1 tablet at 04/28/12 [phone]  . ondansetron (ZOFRAN-ODT) disintegrating tablet 4 mg  4 mg Oral Q6H PRN Verne Spurr, PA-C      . thiamine (B-1) injection 100 mg  100 mg Intramuscular Once [employer], PA-C      . thiamine (VITAMIN B-1) tablet 100 mg  100 mg Oral Daily Verne Spurr, PA-C   100 mg at 04/28/12 0843  . DISCONTD: carbamazepine (TEGRETOL XR) 12 hr tablet 200 mg  200 mg Oral BID Sanjuana Kava, NP        Lab Results:  Results for orders placed  during the hospital encounter of 04/27/12 (from the past 48 hour(s))  CBC     Status: Normal   Collection Time   04/27/12  2:16 AM      Component Value Range Comment   WBC 8.2  4.0 - 10.5 K/uL    RBC 4.96  4.22 - 5.81 MIL/uL    Hemoglobin 16.6  13.0 - 17.0 g/dL    HCT 40.9  81.1 - 91.4 %    MCV 93.5  78.0 - 100.0 fL    MCH 33.5  26.0 - 34.0 pg    MCHC 35.8  30.0 - 36.0 g/dL    RDW 78.2  95.6 - 21.3 %    Platelets 264  150 - 400 K/uL   BASIC METABOLIC PANEL     Status: Abnormal   Collection Time   04/27/12  2:16 AM      Component Value Range Comment   Sodium 134 (*) 135 - 145 mEq/L    Potassium 3.3 (*) 3.5 - 5.1 mEq/L    Chloride 97  96 - 112 mEq/L    CO2 22  19 - 32 mEq/L    Glucose, Bld 117 (*) 70 - 99 mg/dL    BUN 12  6 - 23 mg/dL    Creatinine, Ser 0.86  0.50 - 1.35 mg/dL    Calcium 9.2  8.4 - 57.8 mg/dL     GFR calc non Af Amer 79 (*) >90 mL/min    GFR calc Af Amer >90  >90 mL/min   TROPONIN I     Status: Normal   Collection Time   04/27/12  2:16 AM      Component Value Range Comment   Troponin I <0.30  <0.30 ng/mL   ETHANOL     Status: Abnormal   Collection Time   04/27/12  2:16 AM      Component Value Range Comment   Alcohol, Ethyl (B) 98 (*) 0 - 11 mg/dL   URINE RAPID DRUG SCREEN (HOSP PERFORMED)     Status: Abnormal   Collection Time   04/27/12  5:55 AM      Component Value Range Comment   Opiates NONE DETECTED  NONE DETECTED    Cocaine POSITIVE (*) NONE DETECTED    Benzodiazepines POSITIVE (*) NONE DETECTED    Amphetamines NONE DETECTED  NONE DETECTED    Tetrahydrocannabinol NONE DETECTED  NONE DETECTED    Barbiturates NONE DETECTED  NONE DETECTED     Physical Findings: AIMS: Facial and Oral Movements Muscles of Facial Expression: None, normal Lips and Perioral Area: None, normal Jaw: None, normal Tongue: None, normal,Extremity Movements Upper (arms, wrists, hands, fingers): None, normal Lower (legs, knees, ankles, toes): None, normal, Trunk Movements Neck, shoulders, hips: None, normal, Overall Severity Severity of abnormal movements (highest score from questions above): None, normal Incapacitation due to abnormal movements: None, normal Patient's awareness of abnormal movements (rate only patient's report): No Awareness, Dental Status Current problems with teeth and/or dentures?: No Does patient usually wear dentures?: No  CIWA:  CIWA-Ar Total: 6  COWS:     Treatment Plan Summary: Daily contact with patient to assess and evaluate symptoms and progress in treatment Medication management No signs/symptoms of withdrawal from abusable substances. No further black outs.  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  Leveta Wahab 04/28/2012, 1:46 PM

## 2012-04-29 LAB — LIPID PANEL
Cholesterol: 182 mg/dL (ref 0–200)
HDL: 29 mg/dL — ABNORMAL LOW (ref >39)
Triglycerides: 290 mg/dL — ABNORMAL HIGH (ref <150)
VLDL: 58 mg/dL — ABNORMAL HIGH (ref 0–40)

## 2012-04-29 LAB — TSH: TSH: 3.086 u[IU]/mL (ref 0.350–4.500)

## 2012-04-29 LAB — HEMOGLOBIN A1C: Hgb A1c MFr Bld: 5.8 % — ABNORMAL HIGH (ref <5.7)

## 2012-04-29 NOTE — Progress Notes (Signed)
Psychoeducational Group Note  Date:  04/29/2012 Time: 1100     Group Topic/Focus:  Recovery Goals:   The focus of this group is to identify appropriate goals for recovery and establish a plan to achieve them.  Participation Level:  Did Not Attend  Participation Quality:  Did Not Attend     Affect:  Did Not Attend   Cognitive:  Did Not Attend  Insight:  Did Not Attend  Engagement in Group: Did Not Attend   Additional Comments: Pt was lying in bed asleep, refused to come to group.  Lenox Ahr 04/29/2012, 1:29 PM

## 2012-04-29 NOTE — Progress Notes (Signed)
D: Pt denies SI/HI/AVH. Pt depressed.  Pt states that he felt "fine", however mood and affect are not indicative of statement.  Affect is flat. Pt interaction is minimal. Pt brightens on approach, however no initiation of contact is present. A: Support and encouragement offered to pt. R: Pt receptive.

## 2012-04-29 NOTE — Progress Notes (Signed)
Pt attended discharge planning group and actively participated in group.  SW provided pt with today's workbook.  Pt presents with calm mood and affect.  Pt denies having depression, anxiety and SI.  Pt reports feeling a lot better today.  Pt states that he was on Xanax and Valium for 6 years and reports feeling a lot better today.  Pt notes that he was even able to come to group today.  Pt continues to discuss his blackout before admission being due to being on Xanax and Valium.  Pt continues to talk about not remembering taking his ankle bracelet off.  Pt asked if SW can write a letter and call his probation officer and explain that the blackout was due to the meds.  SW states that SW cannot confirm that.  Pt's probation officer called SW and states that he is already aware of why pt is in the hopsital and knows about his drug use.  Pt will refer pt to [facility] for medication management and therapy.  No further needs voiced by pt at this time.    Reyes Ivan, LCSWA 04/29/2012  10:41 AM

## 2012-04-29 NOTE — Progress Notes (Signed)
[facility] Group Notes: (Counselor/Nursing/MHT/Case Management/Adjunct) 04/29/2012   @  1:15-2:30pm Overcoming Obstacles to Wellness   Type of Therapy:  Group Therapy  Participation Level:  Did Not Attend    Kenita Bines, LCSW 04/28/2012  4:02pm 

## 2012-04-29 NOTE — Progress Notes (Signed)
D:  Patient spent most of the day in bed today.  Refused to go to any groups stating that he was tired.  States he is going home tomorrow.  Tolerating medications well.  Talked a great deal about his accidents this morning.  Denies withdrawal symptoms.  A:  Encouraged participation on the unit.  Offered support and encouragement.  R:  Has been pleasant and interactive with staff, but declines any groups at this time.

## 2012-04-29 NOTE — Therapy (Signed)
Psychoeducational Group Note  Date:  04/29/2012 Time:  2005  Group Topic/Focus:  Wrap-Up Group:   The focus of this group is to help patients review their daily goal of treatment and discuss progress on daily workbooks.  Participation Level:  Active  Participation Quality:  Sharing  Affect:  Appropriate  Cognitive:  Alert  Insight:  Good  Engagement in Group:  Good  Additional Comments:   Patient attended and participated in wrap-up group this evening.  Darchelle Nunes, [place] Pigg 04/29/2012, 8:53 PM

## 2012-04-29 NOTE — Progress Notes (Addendum)
D: Pt denies SI/HI/AV. Pt is pleasant and cooperative. Pt feels he is ready to go and thinks he should be ready to leave tomorrow. Pt on previous shift was having nausea but when asked about it stated he was feeling better.   A: Pt was offered support and encouragement. Pt was given scheduled medications. Pt was encourage to attend groups. Q 15 minute checks were done for safety.  R:Pt attends groups and interacts well with peers and staff. Pt taking medication. Pt has no complaints.Pt receptive to treatment and safety maintained on unit.

## 2012-04-29 NOTE — Progress Notes (Signed)
[facility] Group Notes:  (Counselor/Nursing/MHT/Case Management/Adjunct) 04/29/2012  1:15pm-2:30pm Feelings About Diagnosis   Type of Therapy:  Group Therapy  Participation Level:  Did Not Attend     Angus Palms, LCSW 04/29/2012  3:40 PM

## 2012-04-30 DIAGNOSIS — T424X5A Adverse effect of benzodiazepines, initial encounter: Secondary | ICD-10-CM

## 2012-04-30 DIAGNOSIS — T50995A Adverse effect of other drugs, medicaments and biological substances, initial encounter: Secondary | ICD-10-CM

## 2012-04-30 MED ORDER — GABAPENTIN 100 MG PO CAPS: 100.0000 mg | ORAL_CAPSULE | Freq: Four times a day (QID) | ORAL | Status: DC

## 2012-04-30 MED ORDER — CARBAMAZEPINE 200 MG PO TABS: 200.0000 mg | ORAL_TABLET | Freq: Three times a day (TID) | ORAL | Status: DC

## 2012-04-30 MED ORDER — OMEGA-3-ACID ETHYL ESTERS 1 G PO CAPS
2.0000 g | ORAL_CAPSULE | Freq: Two times a day (BID) | ORAL | Status: DC
  Filled 2012-04-30 (×2): qty 28

## 2012-04-30 MED ORDER — GABAPENTIN 100 MG PO CAPS
100.0000 mg | ORAL_CAPSULE | Freq: Four times a day (QID) | ORAL | Status: DC
  Filled 2012-04-30 (×4): qty 56

## 2012-04-30 MED ORDER — OMEGA-3-ACID ETHYL ESTERS 1 G PO CAPS: 2.0000 g | ORAL_CAPSULE | Freq: Two times a day (BID) | ORAL | Status: DC

## 2012-04-30 NOTE — [facility] Suicide Risk Assessment (Addendum)
Suicide Risk Assessment  Discharge Assessment     Demographic factors:  Male;Adolescent or young adult;Caucasian;Unemployed Unemployed  Current Mental Status Per Nursing Assessment::   On Admission:   (denies si/hi/av. ) At Discharge:   (Denies SI/HI)  Loss Factors: Decrease in vocational status;Legal issues  Historical Factors: Family history of mental illness or substance abuse;Impulsivity  Risk Reduction Factors:   Responsible for children under 58 years of age;Religious beliefs about death;Positive social support  Continued Clinical Symptoms:  Severe Anxiety and/or Agitation Bipolar Disorder:   Bipolar II Alcohol/Substance Abuse/Dependencies Epilepsy Previous Psychiatric Diagnoses and Treatments  Cognitive Features That Contribute To Risk:  Closed-mindedness Thought constriction (tunnel vision)    Suicide Risk:  Minimal: No identifiable suicidal ideation.  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 Bipolar affective disorder   . Anxiety   . 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 Alcohol abuse   . Cocaine abuse    Axis IV: other psychosocial or environmental problems Axis V: 41-50 serious symptoms  Current Mental Status Per Physician:  ADL's:  Intact  Sleep: Good  Appetite:  Good  Suicidal Ideation:  Pt denies any thoughts, plans, intent of suicide Homicidal Ideation:  Pt denies any thoughts, plans, intent of homicide  Mental Status Examination/Evaluation: Objective:  Appearance: Casual  Eye Contact::  Good  Speech:  Clear and Coherent  Volume:  Normal  Mood:  Euthymic  Affect:  Congruent  Thought Process:  Coherent  Orientation:  Full   Thought Content:  WDL  Suicidal Thoughts:  No  Homicidal Thoughts:  No  Memory:  Immediate;   Fair Recent;   Fair Remote;   Fair  Judgement:  Fair  Insight:  Fair  Psychomotor Activity:  Normal  Concentration:  Fair  Recall:  Fair  Akathisia:  No  Handed:  Right  AIMS (if indicated):     Assets:  Communication Skills Desire for Improvement  Sleep:  Number of Hours: 6    ROS: Neuro: no headaches, ataxia, weakness  GI: no N/V/D/cramps/constipation  MS: no weakness, muscle cramps, aches.  Vital Signs:Blood pressure 143/98, pulse 70, temperature 96.8 F (36 C), temperature source Oral, resp. rate 17, height 5' 8.5" (1.74 m), weight 126.554 kg (279 lb). Current Medications: Current Facility-Administered Medications  Medication Dose Route Frequency Provider Last Rate Last Dose  . acetaminophen (TYLENOL) tablet 650 mg  650 mg Oral Q6H PRN Verne Spurr, PA-C      . alum & mag hydroxide-simeth (MAALOX/MYLANTA) 200-200-20 MG/5ML suspension 30 mL  30 mL Oral Q4H PRN Verne Spurr, PA-C      . carbamazepine (TEGRETOL) tablet 200 mg  200 mg Oral TID Mike Craze, MD   200 mg at 04/30/12 0758  . chlordiazePOXIDE (LIBRIUM) capsule 25 mg  25 mg Oral Q6H PRN Verne Spurr, PA-C   25 mg at 04/28/12 0846  . gabapentin (NEURONTIN) capsule 100 mg  100 mg Oral TID Mike Craze, MD   100 mg at 04/30/12 0758  . hydrOXYzine (ATARAX/VISTARIL) tablet 25 mg  25 mg Oral Q6H PRN Verne Spurr, PA-C   25 mg at 04/27/12 2337  . loperamide (IMODIUM) capsule 2-4 mg  2-4 mg Oral PRN Verne Spurr, PA-C  4 mg at 04/28/12 0901  . magnesium hydroxide (MILK OF MAGNESIA) suspension 30 mL  30 mL Oral Daily PRN Verne Spurr, PA-C      . multivitamin with minerals tablet 1 tablet  1 tablet Oral Daily Verne Spurr, PA-C   1 tablet at 04/30/12 0758  . ondansetron (ZOFRAN-ODT) disintegrating tablet 4 mg  4 mg Oral Q6H PRN Verne Spurr, PA-C   4 mg at 04/30/12 0826  . thiamine (B-1) injection 100 mg  100 mg  Intramuscular Once [employer], PA-C      . thiamine (VITAMIN B-1) tablet 100 mg  100 mg Oral Daily Verne Spurr, PA-C   100 mg at 04/30/12 4098    Lab Results:  Results for orders placed during the hospital encounter of 04/27/12 (from the past 72 hour(s))  T3, FREE     Status: Normal   Collection Time   04/28/12  8:39 PM      Component Value Range Comment   T3, Free 3.4  2.3 - 4.2 pg/mL   T4, FREE     Status: Normal   Collection Time   04/28/12  8:39 PM      Component Value Range Comment   Free T4 0.93  0.80 - 1.80 ng/dL   TSH     Status: Normal   Collection Time   04/28/12  8:39 PM      Component Value Range Comment   TSH 3.086  0.350 - 4.500 uIU/mL   HEMOGLOBIN A1C     Status: Abnormal   Collection Time   04/28/12  8:39 PM      Component Value Range Comment   Hemoglobin A1C 5.8 (*) <5.7 %    Mean Plasma Glucose 120 (*) <117 mg/dL   LIPID PANEL     Status: Abnormal   Collection Time   04/29/12  6:13 AM      Component Value Range Comment   Cholesterol 182  0 - 200 mg/dL    Triglycerides 119 (*) <150 mg/dL    HDL 29 (*) >14 mg/dL    Total CHOL/HDL Ratio 6.3      VLDL 58 (*) 0 - 40 mg/dL    LDL Cholesterol 95  0 - 99 mg/dL   CARBAMAZEPINE LEVEL, TOTAL     Status: Normal   Collection Time   04/30/12  6:25 AM      Component Value Range Comment   Carbamazepine Lvl 5.9  4.0 - 12.0 ug/mL     RISK REDUCTION FACTORS: What pt has learned from hospital stay is that Xanax and Valium have effected his thinking and that he wishes that he has done something earlier about them.  He is now facing some legal issues as a result.  Risk of self harm is elevated by his mood dyscontrol and anxiety as well as his addictions, but he has Jesus, his family, and himself to live for.  Risk of harm to others is minimal in that he has not been involved in fights or had any legal charges filed on him.  Pt seen in treatment team where he divulged the above information. The treatment team concluded that he  was ready for discharge and had met his goals for an inpatient setting.  PLAN: Discharge home Continue Medication List  As of 04/30/2012  1:59 PM   STOP taking these medications         alprazolam 2 MG tablet      diazepam 10 MG tablet  QUEtiapine 50 MG tablet         TAKE these medications      Indication    carbamazepine 200 MG tablet   Commonly known as: TEGRETOL   Take 1 tablet (200 mg total) by mouth 3 (three) times daily. 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 For anxiety and management of pain.  May be helpful for sleep.       omega-3 acid ethyl esters 1 G capsule   Commonly known as: LOVAZA   Take 2 capsules (2 g total) by mouth 2 (two) times daily. To rehabilitate your brain you need to get regular exercise, regular sleep and take fish oil twice a day.            Follow-up recommendations:  Activities: Resume typical activities Diet: Resume typical diet Other: Follow up with outpatient provider and report any side effects to out patient prescriber.  Plan: Pt notes that his thinking is clearer.  He feels that the Xanax and Valium had really effected his thinking and he was addicted to them.  He has used alcohol in the past and then had a physician prescribe a substitute for the same effect with out fully helping his brain recover from the traumas.  He has an addiction problem underlying his physical trauma to his brain.  He was advised that Fish Oil may be helpful for his brain recovery.  This was started for him at time of discharge.  He has met the goals of inpatient stay.  D/C today.  Janiyla Long 04/30/2012, 1:58 PM

## 2012-04-30 NOTE — Progress Notes (Signed)
[facility] Group Notes:  (Counselor/Nursing/MHT/Case Management/Adjunct)  04/30/2012 1:53 PM  Type of Therapy:  Psychoeducational Skills  Participation Level:  Minimal  Participation Quality:  Attentive  Affect:  Blunted  Cognitive:  Appropriate and Oriented  Insight:  Good  Engagement in Group:  Good  Engagement in Therapy:  n/a  Modes of Intervention:  Activity, Education, Problem-solving, Socialization and Support  Summary of Progress/Problems:  Gary Stanley attended Psychoeducational group that focused on using quality time with support systems/individuals to engage in [employer]. Gary Stanley observed while peers engaged in activity guessing about self and peers. Gary Stanley was quiet but attentive while group discussed who their support systems are, how they can spend positive quality time with them as a coping skill and a way to strengthen their relationship. Gary Stanley was given a homework assignment to find two ways to improve his support systems and twenty activities he can do to spend quality time with his supports.    Gary Stanley 04/30/2012, 1:53 PM

## 2012-04-30 NOTE — Progress Notes (Addendum)
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 Bipolar affective disorder   . Anxiety   . 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 Alcohol abuse   . Cocaine abuse    Axis IV: other psychosocial or environmental problems Axis V: 41-50 serious symptoms  ADL's:  Intact  Sleep: Good  Appetite:  Good  Suicidal Ideation:  Pt denies any thoughts, plans, intent of suicide Homicidal Ideation:  Pt denies any thoughts, plans, intent of homicide  Mental Status Examination/Evaluation: Objective:  Appearance: Casual  Eye Contact::  Good  Speech:  Clear and Coherent  Volume:  Normal  Mood:  Euthymic  Affect:  Congruent  Thought Process:  Coherent  Orientation:  Full  Thought Content:  WDL  Suicidal Thoughts:  No  Homicidal Thoughts:  No  Memory:  Immediate;   Fair Recent;   Fair Remote;   Fair  Judgement:  Fair  Insight:  Fair  Psychomotor Activity:  Normal  Concentration:  Fair  Recall:  Fair  Akathisia:  No  Handed:  Right  AIMS (if indicated):     Assets:  Communication Skills Desire for Improvement  Sleep:  Number of Hours: 6    ROS: Neuro: no headaches, ataxia, weakness  GI: no N/V/D/cramps/constipation  MS: no weakness, muscle cramps, aches.  Vital Signs:Blood pressure 143/98, pulse 70, temperature 96.8 F (36 C), temperature source Oral, resp. rate 17, height 5' 8.5" (1.74 m), weight 126.554 kg (279 lb). Current Medications: Current Facility-Administered Medications  Medication Dose Route Frequency Provider Last Rate Last Dose  . acetaminophen (TYLENOL) tablet 650 mg  650 mg Oral Q6H PRN Verne Spurr, PA-C      . alum & mag hydroxide-simeth (MAALOX/MYLANTA) 200-200-20 MG/5ML suspension 30 mL  30 mL Oral Q4H PRN Verne Spurr, PA-C      . carbamazepine (TEGRETOL) tablet  200 mg  200 mg Oral TID Mike Craze, MD   200 mg at 04/30/12 0758  . chlordiazePOXIDE (LIBRIUM) capsule 25 mg  25 mg Oral Q6H PRN Verne Spurr, PA-C   25 mg at 04/28/12 0846  . gabapentin (NEURONTIN) capsule 100 mg  100 mg Oral TID Mike Craze, MD   100 mg at 04/30/12 0758  . hydrOXYzine (ATARAX/VISTARIL) tablet 25 mg  25 mg Oral Q6H PRN Verne Spurr, PA-C   25 mg at 04/27/12 2337  . loperamide (IMODIUM) capsule 2-4 mg  2-4 mg Oral PRN Verne Spurr, PA-C   4 mg at 04/28/12 0901  . magnesium hydroxide (MILK OF MAGNESIA) suspension 30 mL  30 mL Oral Daily PRN Verne Spurr, PA-C      . multivitamin with minerals tablet 1 tablet  1 tablet Oral Daily Verne Spurr, PA-C   1 tablet at 04/30/12 0758  . ondansetron (ZOFRAN-ODT) disintegrating tablet 4 mg  4 mg Oral Q6H PRN Verne Spurr, PA-C   4 mg at 04/30/12 0826  . thiamine (B-1) injection 100 mg  100 mg Intramuscular Once [employer], PA-C      . thiamine (VITAMIN B-1) tablet 100 mg  100 mg Oral Daily Verne Spurr, PA-C   100 mg at 04/30/12 1610    Lab Results:  Results for orders placed during the hospital encounter of 04/27/12 (from the past 48 hour(s))  T3, FREE     Status: Normal  Collection Time   04/28/12  8:39 PM      Component Value Range Comment   T3, Free 3.4  2.3 - 4.2 pg/mL   T4, FREE     Status: Normal   Collection Time   04/28/12  8:39 PM      Component Value Range Comment   Free T4 0.93  0.80 - 1.80 ng/dL   TSH     Status: Normal   Collection Time   04/28/12  8:39 PM      Component Value Range Comment   TSH 3.086  0.350 - 4.500 uIU/mL   HEMOGLOBIN A1C     Status: Abnormal   Collection Time   04/28/12  8:39 PM      Component Value Range Comment   Hemoglobin A1C 5.8 (*) <5.7 %    Mean Plasma Glucose 120 (*) <117 mg/dL   LIPID PANEL     Status: Abnormal   Collection Time   04/29/12  6:13 AM      Component Value Range Comment   Cholesterol 182  0 - 200 mg/dL    Triglycerides 409 (*) <150 mg/dL    HDL 29 (*) >81  mg/dL    Total CHOL/HDL Ratio 6.3      VLDL 58 (*) 0 - 40 mg/dL    LDL Cholesterol 95  0 - 99 mg/dL   CARBAMAZEPINE LEVEL, TOTAL     Status: Normal   Collection Time   04/30/12  6:25 AM      Component Value Range Comment   Carbamazepine Lvl 5.9  4.0 - 12.0 ug/mL     Physical Findings: AIMS: Facial and Oral Movements Muscles of Facial Expression: None, normal Lips and Perioral Area: None, normal Jaw: None, normal Tongue: None, normal,Extremity Movements Upper (arms, wrists, hands, fingers): None, normal Lower (legs, knees, ankles, toes): None, normal, Trunk Movements Neck, shoulders, hips: None, normal, Overall Severity Severity of abnormal movements (highest score from questions above): None, normal Incapacitation due to abnormal movements: None, normal Patient's awareness of abnormal movements (rate only patient's report): No Awareness, Dental Status Current problems with teeth and/or dentures?: No Does patient usually wear dentures?: No  CIWA:  CIWA-Ar Total: 0  COWS:     Treatment Plan Summary: Daily contact with patient to assess and evaluate symptoms and progress in treatment Medication management No signs/symptoms of withdrawal from abusable substances. No further black outs.  Plan: Pt notes that his thinking is clearer.  He feels that the Xanax and Valium had really effected his thinking and he was addicted to them.  He has used alcohol in the past and then had a physician prescribe a substitute for the same effect with out fully helping his brain recover from the traumas.  He has an addiction problem underlying his physical trauma to his brain.  He was advised that Fish Oil may be helpful for his brain recovery.  This was started for him at time of discharge.  He has met the goals of inpatient stay.  D/C today.  Gary Stanley 04/29/2012, 12:42 PM

## 2012-04-30 NOTE — Tx Team (Signed)
Interdisciplinary Treatment Plan Update (Adult)  Date:  04/30/2012  Time Reviewed:  10:49 AM   Progress in Treatment: Attending groups: Yes Participating in groups:  Yes Taking medication as prescribed: Yes Tolerating medication:  Yes Family/Significant other contact made:  Counselor will attempt contact with father today Patient understands diagnosis:  Yes Discussing patient identified problems/goals with staff:  Yes Medical problems stabilized or resolved:  Yes Denies suicidal/homicidal ideation: Yes Issues/concerns per patient self-inventory:  None identified Other: N/A  New problem(s) identified: None Identified  Reason for Continuation of Hospitalization: Stable to d/c  Interventions implemented related to continuation of hospitalization: Stable to d/c  Additional comments: N/A  Estimated length of stay: D/C today  Discharge Plan: Pt will follow up with [facility] for medication management  New goal(s): N/A  Review of initial/current patient goals per problem list:    1.  Goal(s): Reduce depressive symptoms  Met:  Yes  Target date: by discharge  As evidenced by: Reducing depression from a 10 to a 3 as reported by pt. Pt denies having depression today.   2.  Goal (s): Reduce/Eliminate suicidal ideation  Met:  Yes  Target date: by discharge  As evidenced by: pt denies SI.  3.  Goal(s): Reduce anxiety symptoms  Met:  No  Target date: by discharge  As evidenced by: Reduce anxiety from a 10 to a 3 as reported by pt. Pt rates at a 4 today but reports feeling stable to d/c today.     Attendees: Patient:  Gary Stanley 04/30/2012 10:51 AM   Family:     Physician:  Orson Aloe, MD  04/30/2012  10:49 AM   Nursing:   Omelia Blackwater, RN 04/30/2012 10:51 AM   Case Manager:  Reyes Ivan, LCSWA 04/30/2012  10:49 AM   Counselor:  Angus Palms, LCSW 04/30/2012  10:49 AM   Other:  Juline Patch, LCSW 04/30/2012  10:49 AM   Other:  Berneice Heinrich, RN  04/30/2012 10:52 AM   Other:     Other:      Scribe for Treatment Team:   Carmina Miller, 04/30/2012 , 10:49 AM

## 2012-04-30 NOTE — Progress Notes (Signed)
Psychoeducational Group Note  Date:  04/30/2012 Time:  1100  Group Topic/Focus:  Personal Choices and Values:   The focus of this group is to help patients assess and explore the importance of values in their lives, how their values affect their decisions, how they express their values and what opposes their expression.  Participation Level:  Active  Participation Quality:  Appropriate and Sharing  Affect:  Appropriate  Cognitive:  Alert and Appropriate  Insight:  Good  Engagement in Group:  Good  Additional Comments:  Pt was appropriate and active while in group. Pt shared his top three personal values from the provided worksheet.   Sharyn Lull 04/30/2012, 11:42 AM

## 2012-04-30 NOTE — Progress Notes (Signed)
[facility] MD Progress Note  04/29/2012 12:42 PM  Diagnosis:   Axis I: Polysubstance Abuse, rule out chronic traumatic encephalopathy Axis II: Deferred Axis III:  Past Medical History  Diagnosis Date  . Bipolar affective disorder   . Anxiety   . 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 Alcohol abuse   . Cocaine abuse    Axis IV: other psychosocial or environmental problems Axis V: 41-50 serious symptoms  ADL's:  Intact  Sleep: Poor  Appetite:  Fair  Suicidal Ideation:  Pt denies any thoughts, plans, intent of suicide Homicidal Ideation:  Pt denies any thoughts, plans, intent of homicide  Mental Status Examination/Evaluation: Objective:  Appearance: Casual  Eye Contact::  Good  Speech:  Clear and Coherent  Volume:  Normal  Mood:  Anxious and Depressed  Affect:  Congruent  Thought Process:  Coherent  Orientation:  Full  Thought Content:  WDL  Suicidal Thoughts:  No  Homicidal Thoughts:  No  Memory:  Immediate;   Fair Recent;   Fair Remote;   Fair  Judgement:  Fair  Insight:  Fair  Psychomotor Activity:  Normal  Concentration:  Fair  Recall:  Fair  Akathisia:  No  Handed:  Right  AIMS (if indicated):     Assets:  Communication Skills Desire for Improvement  Sleep:  Number of Hours: 6    ROS: Neuro: no headaches, ataxia, weakness  GI: no N/V/D/cramps/constipation  MS: no weakness, muscle cramps, aches.  Vital Signs:Blood pressure 143/95, pulse 74, temperature 96.6 F (35.9 C), temperature source Oral, resp. rate 16, height 5' 8.5" (1.74 m), weight 126.554 kg (279 lb). Current Medications: Current Facility-Administered Medications  Medication Dose Route Frequency Provider Last Rate Last Dose  . acetaminophen (TYLENOL) tablet 650 mg  650 mg Oral Q6H PRN Verne Spurr, PA-C      . alum & mag hydroxide-simeth (MAALOX/MYLANTA) 200-200-20 MG/5ML suspension 30 mL  30 mL Oral Q4H PRN Verne Spurr, PA-C      . carbamazepine (TEGRETOL) tablet 200 mg  200 mg Oral TID Mike Craze, MD    200 mg at 04/29/12 2006  . chlordiazePOXIDE (LIBRIUM) capsule 25 mg  25 mg Oral Q6H PRN Verne Spurr, PA-C   25 mg at 04/28/12 0846  . gabapentin (NEURONTIN) capsule 100 mg  100 mg Oral TID Mike Craze, MD   100 mg at 04/29/12 1718  . hydrOXYzine (ATARAX/VISTARIL) tablet 25 mg  25 mg Oral Q6H PRN Verne Spurr, PA-C   25 mg at 04/27/12 2337  . loperamide (IMODIUM) capsule 2-4 mg  2-4 mg Oral PRN Verne Spurr, PA-C   4 mg at 04/28/12 0901  . magnesium hydroxide (MILK OF MAGNESIA) suspension 30 mL  30 mL Oral Daily PRN Verne Spurr, PA-C      . multivitamin with minerals tablet 1 tablet  1 tablet Oral Daily Verne Spurr, PA-C   1 tablet at 04/29/12 0804  . ondansetron (ZOFRAN-ODT) disintegrating tablet 4 mg  4 mg Oral Q6H PRN Verne Spurr, PA-C   4 mg at 04/29/12 1830  . thiamine (B-1) injection 100 mg  100 mg Intramuscular Once [employer], PA-C      . thiamine (VITAMIN B-1) tablet 100 mg  100 mg Oral Daily Verne Spurr, PA-C   100 mg at 04/29/12 1610    Lab Results:  Results for orders placed during the hospital encounter of 04/27/12 (from the past 48 hour(s))  T3, FREE     Status: Normal   Collection Time  04/28/12  8:39 PM      Component Value Range Comment   T3, Free 3.4  2.3 - 4.2 pg/mL   T4, FREE     Status: Normal   Collection Time   04/28/12  8:39 PM      Component Value Range Comment   Free T4 0.93  0.80 - 1.80 ng/dL   TSH     Status: Normal   Collection Time   04/28/12  8:39 PM      Component Value Range Comment   TSH 3.086  0.350 - 4.500 uIU/mL   HEMOGLOBIN A1C     Status: Abnormal   Collection Time   04/28/12  8:39 PM      Component Value Range Comment   Hemoglobin A1C 5.8 (*) <5.7 %    Mean Plasma Glucose 120 (*) <117 mg/dL   LIPID PANEL     Status: Abnormal   Collection Time   04/29/12  6:13 AM      Component Value Range Comment   Cholesterol 182  0 - 200 mg/dL    Triglycerides 161 (*) <150 mg/dL    HDL 29 (*) >09 mg/dL    Total CHOL/HDL Ratio 6.3      VLDL  58 (*) 0 - 40 mg/dL    LDL Cholesterol 95  0 - 99 mg/dL     Physical Findings: AIMS: Facial and Oral Movements Muscles of Facial Expression: None, normal Lips and Perioral Area: None, normal Jaw: None, normal Tongue: None, normal,Extremity Movements Upper (arms, wrists, hands, fingers): None, normal Lower (legs, knees, ankles, toes): None, normal, Trunk Movements Neck, shoulders, hips: None, normal, Overall Severity Severity of abnormal movements (highest score from questions above): None, normal Incapacitation due to abnormal movements: None, normal Patient's awareness of abnormal movements (rate only patient's report): No Awareness, Dental Status Current problems with teeth and/or dentures?: No Does patient usually wear dentures?: No  CIWA:  CIWA-Ar Total: 0  COWS:     Treatment Plan Summary: Daily contact with patient to assess and evaluate symptoms and progress in treatment Medication management No signs/symptoms of withdrawal from abusable substances. No further black outs.  Plan: Pt reports that he notices that his thinking is coming around.  He is hoping that we can produce a letter that will explain to the judge why he had a blackout.  He has had a blackout before on the Valium and Xanax, but kept taking it.  He seems to hope that the judge will not hold him responsible.  This may not be the case.  He seems to be doing better could consider D/C tomorrow.  Gary Stanley 04/29/2012, 12:42 PM

## 2012-04-30 NOTE — Progress Notes (Addendum)
[facility] Family/Significant Other Suicide Prevention Education  Suicide Prevention Education:  Education Completed; Denim Kalmbach, father [phone]) has been identified by the patient as the family member/significant other with whom the patient will be residing, and identified as the person(s) who will aid the patient in the event of a mental health crisis (suicidal ideations/suicide attempt).  With written consent from the patient, the family member/significant other has been provided the following suicide prevention education, prior to the and/or following the discharge of the patient.  The suicide prevention education provided includes the following:  Suicide risk factors  Suicide prevention and interventions  National Suicide Hotline telephone number  [facility] assessment telephone number  [facility] Emergency Assistance 911  [facility] and/or Residential Mobile Crisis Unit telephone number  Request made of family/significant other to:  Remove weapons (e.g., guns, rifles, knives), all items previously/currently identified as safety concern.    Remove drugs/medications (over-the-counter, prescriptions, illicit drugs), all items previously/currently identified as a safety concern.  Arlen reported that he does not have safety concerns and that he does not believe Amun would be suicidal if not for the Xanax he was taking. Counselor explained that he is no longer taking the Xanax and that Dr. Dan Humphreys had noted that they are not to be prescribed to him anymore, but Mr. Salminen seemed to misunderstand, stating that doctors "cover their butts and each other's butts." He was resistant to counselor's attempts to explain that the [facility] staff agreed with his opinion of Xanax being a contributing factor to this episode. Arlen verbalized understanding of suicide prevention information and stated that the guns in the home are all locked up in a safe that Damond does not  have access to. He had no further questions.    Billie Lade 04/30/2012, 3:15 PM

## 2012-04-30 NOTE — Progress Notes (Signed)
[facility] Case Management Discharge Plan:  Will you be returning to the same living situation after discharge: Yes,  return home At discharge, do you have transportation home?:Yes,  access to transportation Do you have the ability to pay for your medications:Yes,  access to meds, provided prescriptions/samples  Release of information consent forms completed and in the chart;  Patient's signature needed at discharge.  Patient to Follow up at:  Follow-up Information    Follow up with [facility] on 05/22/2012. (Appointment scheduled at 2:30 pm with Dr. Guss Bunde)    Contact information:   [address], [address] [place], [place] [address] [phone]         Patient denies SI/HI:   Yes,  denies SI/HI    Safety Planning and Suicide Prevention discussed:  Yes,  discussed with pt  Barrier to discharge identified:No.  Summary and Recommendations: Pt attended discharge planning group and actively participated in group.  SW provided pt with today's workbook.  Pt presents with calm mood and affect.  Pt denies being depressed and denies SI.  Pt rates anxiety at a 4 today.  Pt reports feeling stable to d/c today.  No recommendations from SW.  No further needs voiced by pt.  Pt stable to discharge.     Carmina Miller 04/30/2012, 10:37 AM

## 2012-04-30 NOTE — Progress Notes (Signed)
Pt is D/C home. Pt denies SI/HI/AV. Pt follow-up and medications were reviewed and pt verbalized understanding. Pt pleasant and cooperative. Pt belongings were returned.   

## 2012-05-01 NOTE — Progress Notes (Signed)
Patient Discharge Instructions:  After Visit Summary (AVS):   Faxed to:  05/01/2012 Psychiatric Admission Assessment Note:   Faxed to:  05/01/2012 Suicide Risk Assessment - Discharge Assessment:   Faxed to:  05/01/2012 Faxed/Sent to the Next Level Care provider:  05/01/2012  Faxed to [facility] - Dr. Guss Bunde @ [phone]  Wandra Scot, 05/01/2012, 6:06 PM
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
## 2012-09-04 ENCOUNTER — Observation Stay: Payer: Self-pay | Admitting: Psychiatry

## 2012-09-04 LAB — CBC
HCT: 49 % (ref 40.0–52.0)
MCHC: 34.4 g/dL (ref 32.0–36.0)
Platelet: 222 10*3/uL (ref 150–440)
RBC: 5.11 10*6/uL (ref 4.40–5.90)
RDW: 15.1 % — ABNORMAL HIGH (ref 11.5–14.5)
WBC: 8.6 10*3/uL (ref 3.8–10.6)

## 2012-09-04 LAB — URINALYSIS, COMPLETE
Bilirubin,UR: NEGATIVE
Blood: NEGATIVE
Glucose,UR: NEGATIVE mg/dL (ref 0–75)
Ketone: NEGATIVE
Leukocyte Esterase: NEGATIVE
Nitrite: NEGATIVE
Ph: 5 (ref 4.5–8.0)
Protein: NEGATIVE
RBC,UR: <1 /HPF (ref 0–5)
Specific Gravity: 1.002 (ref 1.003–1.030)
WBC UR: 2 /HPF (ref 0–5)

## 2012-09-04 LAB — COMPREHENSIVE METABOLIC PANEL
Albumin: 3.8 g/dL (ref 3.4–5.0)
Alkaline Phosphatase: 106 U/L (ref 50–136)
Anion Gap: 10 (ref 7–16)
Calcium, Total: 8.4 mg/dL — ABNORMAL LOW (ref 8.5–10.1)
Chloride: 107 mmol/L (ref 98–107)
EGFR (African American): >60
EGFR (Non-African Amer.): >60
Glucose: 112 mg/dL — ABNORMAL HIGH (ref 65–99)
Potassium: 4.1 mmol/L (ref 3.5–5.1)
SGOT(AST): 124 U/L — ABNORMAL HIGH (ref 15–37)

## 2012-09-04 LAB — DRUG SCREEN, URINE
Amphetamines, Ur Screen: NEGATIVE (ref ?–1000)
Cannabinoid 50 Ng, Ur ~~LOC~~: NEGATIVE (ref ?–50)
Cocaine Metabolite,Ur ~~LOC~~: NEGATIVE (ref ?–300)
Methadone, Ur Screen: NEGATIVE (ref ?–300)
Opiate, Ur Screen: NEGATIVE (ref ?–300)
Tricyclic, Ur Screen: NEGATIVE (ref ?–1000)

## 2012-09-04 LAB — ETHANOL: Ethanol: 234 mg/dL

## 2012-10-22 NOTE — ED Notes (Signed)
Pt states that he "wanted to see his nephew" who died two years ago but denies SI. Took 30, 1mg , Xanax this morning and 60 within the last hour. Also drank 1/2 gallon of vodka and 1g of cocaine. He called 911 himself.

## 2012-10-23 ENCOUNTER — Emergency Department (HOSPITAL_COMMUNITY)
Admission: EM | Admit: 2012-10-23 | Discharge: 2012-10-23 | Disposition: A | Discharge status: Home / Self Care | Payer: MEDICAID | Attending: Emergency Medicine | Admitting: Emergency Medicine

## 2012-10-23 ENCOUNTER — Encounter (HOSPITAL_COMMUNITY): Payer: Self-pay

## 2012-10-23 DIAGNOSIS — Y929 Unspecified place or not applicable: Secondary | ICD-10-CM | POA: Insufficient documentation

## 2012-10-23 DIAGNOSIS — F1410 Cocaine abuse, uncomplicated: Secondary | ICD-10-CM | POA: Insufficient documentation

## 2012-10-23 DIAGNOSIS — Z87820 Personal history of traumatic brain injury: Secondary | ICD-10-CM | POA: Insufficient documentation

## 2012-10-23 DIAGNOSIS — T424X1A Poisoning by benzodiazepines, accidental (unintentional), initial encounter: Secondary | ICD-10-CM | POA: Insufficient documentation

## 2012-10-23 DIAGNOSIS — F319 Bipolar disorder, unspecified: Secondary | ICD-10-CM | POA: Insufficient documentation

## 2012-10-23 DIAGNOSIS — F1910 Other psychoactive substance abuse, uncomplicated: Secondary | ICD-10-CM

## 2012-10-23 DIAGNOSIS — F411 Generalized anxiety disorder: Secondary | ICD-10-CM | POA: Insufficient documentation

## 2012-10-23 DIAGNOSIS — Z79899 Other long term (current) drug therapy: Secondary | ICD-10-CM | POA: Insufficient documentation

## 2012-10-23 DIAGNOSIS — Y939 Activity, unspecified: Secondary | ICD-10-CM | POA: Insufficient documentation

## 2012-10-23 DIAGNOSIS — F1010 Alcohol abuse, uncomplicated: Secondary | ICD-10-CM | POA: Insufficient documentation

## 2012-10-23 LAB — SALICYLATE LEVEL: Salicylate Lvl: <2 mg/dL — ABNORMAL LOW (ref 2.8–20.0)

## 2012-10-23 LAB — COMPREHENSIVE METABOLIC PANEL
AST: 31 U/L (ref 0–37)
CO2: 22 mEq/L (ref 19–32)
Calcium: 8.4 mg/dL (ref 8.4–10.5)
Creatinine, Ser: 1.04 mg/dL (ref 0.50–1.35)
GFR calc Af Amer: >90 mL/min (ref >90)
GFR calc non Af Amer: 89 mL/min — ABNORMAL LOW (ref >90)

## 2012-10-23 LAB — RAPID URINE DRUG SCREEN, HOSP PERFORMED
Amphetamines: NOT DETECTED
Barbiturates: NOT DETECTED
Benzodiazepines: POSITIVE — AB
Cocaine: POSITIVE — AB

## 2012-10-23 LAB — CBC
HCT: 45.9 % (ref 39.0–52.0)
Hemoglobin: 16.2 g/dL (ref 13.0–17.0)
RBC: 4.89 MIL/uL (ref 4.22–5.81)
RDW: 13.7 % (ref 11.5–15.5)
WBC: 10.2 10*3/uL (ref 4.0–10.5)

## 2012-10-23 LAB — ACETAMINOPHEN LEVEL: Acetaminophen (Tylenol), Serum: <15 ug/mL (ref 10–30)

## 2012-10-23 MED ORDER — IBUPROFEN 200 MG PO TABS: 600.0000 mg | ORAL_TABLET | Freq: Three times a day (TID) | ORAL | Status: DC | PRN

## 2012-10-23 MED ORDER — ALUM & MAG HYDROXIDE-SIMETH 200-200-20 MG/5ML PO SUSP: 30.0000 mL | ORAL | Status: DC | PRN

## 2012-10-23 MED ORDER — ZIPRASIDONE MESYLATE 20 MG IM SOLR
20.0000 mg | Freq: Once | INTRAMUSCULAR | Status: AC
  Administered 2012-10-23: 20 mg via INTRAMUSCULAR
  Filled 2012-10-23: qty 20

## 2012-10-23 MED ORDER — ONDANSETRON HCL 4 MG PO TABS: 4.0000 mg | ORAL_TABLET | Freq: Three times a day (TID) | ORAL | Status: DC | PRN

## 2012-10-23 NOTE — ED Notes (Signed)
Pt's belongings have been returned.  Waiting on D/C paperwork.

## 2012-10-23 NOTE — ED Provider Notes (Signed)
3:30 PM patient alert pleasant cooperative Glasgow Coma Score 15. Patient agreeable outpatient drug and alcohol treatment program, as recommended by psychiatry. Patient is stable for discharge home  Doug Sou, MD 10/24/12 [phone]

## 2012-10-23 NOTE — ED Notes (Signed)
Pt alert and oriented x4. Respirations even and unlabored. Bilateral rise and fall of chest. Skin warm and dry. In no acute distress. Denies needs.  

## 2012-10-23 NOTE — ED Notes (Signed)
Report received-airway intact, no s/s's of distress-will continue to monitor 

## 2012-10-23 NOTE — ED Notes (Signed)
Pt asleep after administration of Geodon.

## 2012-10-23 NOTE — ED Provider Notes (Addendum)
History     CSN: [id number]  Arrival date & time 10/23/12  0010   First MD Initiated Contact with Patient 10/23/12 0024      Chief Complaint  Patient presents with  . Drug Overdose    (Consider location/radiation/quality/duration/timing/severity/associated sxs/prior treatment) HPI Level V caveat: Intoxicated; cooperative. EMS reports that the patient has consumed "1/2 gallon" of vodka, 81 mg tablets of Xanax (which she reportedly got from a friend) and a gram of cocaine since yesterday morning. This is reportedly because he wanted to see his dad nephew. The patient himself called EMS. He now denies that he has any complaint and insists that he is sober and wants to leave. He has not been cooperative with staff.  Past Medical History  Diagnosis Date  . Bipolar affective disorder   . Anxiety   . 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 Alcohol abuse   . Cocaine abuse     Past Surgical History  Procedure Date  . Craniectomy     pt had surgery to remove blood clot from brain     No family history on file.  History  Substance Use Topics  . Smoking status: Unknown If Ever Smoked  . Smokeless tobacco: Not on file  . Alcohol Use: 14.4 oz/week    24 Cans of beer per week     Comment: binger; 1/5 and / or case of beer       Review of Systems  Unable to perform ROS   Allergies  Hydrocodone  Home Medications   Current Outpatient Rx  Name  Route  Sig  Dispense  Refill  . ALPRAZOLAM 2 MG PO TABS   Oral   Take 2 mg by mouth 3 (three) times daily as needed. For anxiety         . DIAZEPAM 5 MG PO TABS   Oral   Take 5 mg by mouth every 6 (six) hours as needed. For anxiety           BP 123/84  Pulse 103  Temp 97.4 F (36.3 C) (Oral)  Resp 18  SpO2 96%  Physical Exam General: Well-developed, well-nourished male in no acute distress; appearance consistent with age of record HENT: normocephalic, atraumatic; breath smells of alcohol Eyes: pupils equal round and reactive to light;  extraocular muscles intact Neck: supple Heart: regular rate and rhythm; tachycardia Lungs: Normal respiratory effort and excursion Abdomen: soft; nondistended Extremities: No deformity; full range of motion Neurologic: Awake, dysarthria, ataxia; motor function intact in all extremities and symmetric; no facial droop Skin: Warm and dry Psychiatric: Intoxicated; uncooperative    ED Course  Procedures (including critical care time)    MDM   Nursing notes and vitals signs, including pulse oximetry, reviewed.  Summary of this visit's results, reviewed by myself:  Labs:  Results for orders placed during the hospital encounter of 10/23/12 (from the past 24 hour(s))  CBC     Status: Normal   Collection Time   10/23/12 12:50 AM      Component Value Range   WBC 10.2  4.0 - 10.5 K/uL   RBC 4.89  4.22 - 5.81 MIL/uL   Hemoglobin 16.2  13.0 - 17.0 g/dL   HCT 78.4  69.6 - 29.5 %   MCV 93.9  78.0 - 100.0 fL   MCH 33.1  26.0 - 34.0 pg   MCHC 35.3  30.0 - 36.0 g/dL   RDW 28.4  13.2 - 44.0 %   Platelets 297  150 -  400 K/uL  URINE RAPID DRUG SCREEN (HOSP PERFORMED)     Status: Abnormal   Collection Time   10/23/12  1:29 AM      Component Value Range   Opiates NONE DETECTED  NONE DETECTED   Cocaine POSITIVE (*) NONE DETECTED   Benzodiazepines POSITIVE (*) NONE DETECTED   Amphetamines NONE DETECTED  NONE DETECTED   Tetrahydrocannabinol NONE DETECTED  NONE DETECTED   Barbiturates NONE DETECTED  NONE DETECTED  COMPREHENSIVE METABOLIC PANEL     Status: Abnormal   Collection Time   10/23/12  4:10 AM      Component Value Range   Sodium 135  135 - 145 mEq/L   Potassium 3.8  3.5 - 5.1 mEq/L   Chloride 102  96 - 112 mEq/L   CO2 22  19 - 32 mEq/L   Glucose, Bld 107 (*) 70 - 99 mg/dL   BUN 17  6 - 23 mg/dL   Creatinine, Ser 1.61  0.50 - 1.35 mg/dL   Calcium 8.4  8.4 - 09.6 mg/dL   Total Protein 6.6  6.0 - 8.3 g/dL   Albumin 3.5  3.5 - 5.2 g/dL   AST 31  0 - 37 U/L   ALT 35  0 - 53 U/L    Alkaline Phosphatase 84  39 - 117 U/L   Total Bilirubin 0.3  0.3 - 1.2 mg/dL   GFR calc non Af Amer 89 (*) >90 mL/min   GFR calc Af Amer >90  >90 mL/min  ETHANOL     Status: Abnormal   Collection Time   10/23/12  4:10 AM      Component Value Range   Alcohol, Ethyl (B) 57 (*) 0 - 11 mg/dL  SALICYLATE LEVEL     Status: Abnormal   Collection Time   10/23/12  4:10 AM      Component Value Range   Salicylate Lvl <2.0 (*) 2.8 - 20.0 mg/dL  ACETAMINOPHEN LEVEL     Status: Normal   Collection Time   10/23/12  4:10 AM      Component Value Range   Acetaminophen (Tylenol), Serum <15.0  10 - 30 ug/mL   6:22 AM Patient given Geodon soon after arrival due to belligerent behavior. He has been sleeping subsequently. We will have telepsychiatry evaluate him after he is again able to engage in conversation.          Hanley Seamen, MD 10/23/12 0454  Hanley Seamen, MD 10/23/12 0981

## 2012-10-23 NOTE — ED Notes (Signed)
Pt escorted to discharge window. Verbalized understanding discharge instructions. In no acute distress. Pt became irritated and refused to stay long enough to receive ACT team paperwork.  Pt given a bus pass.

## 2012-10-23 NOTE — ED Notes (Signed)
Sitter at bedside.

## 2012-10-23 NOTE — ED Notes (Signed)
[id number]<QI> Expected date:<BR> Expected time:<BR> Means of arrival:<BR> Comments:<BR> EMS OD/SI

## 2012-10-23 NOTE — ED Notes (Signed)
Telepsych taken to Pt's room.

## 2012-10-23 NOTE — ED Notes (Signed)
Patient states he was drinking and "took a bunch of pills"-was not his intention to hurt self-denies SI at this time

## 2012-10-23 NOTE — ED Notes (Signed)
Pt is aware of the need for urine. 

## 2012-10-27 ENCOUNTER — Encounter (HOSPITAL_COMMUNITY): Payer: Self-pay | Admitting: *Deleted

## 2012-10-27 ENCOUNTER — Emergency Department (HOSPITAL_COMMUNITY)
Admission: EM | Admit: 2012-10-27 | Discharge: 2012-10-28 | Disposition: A | Discharge status: Home / Self Care | Payer: 59 | Attending: Emergency Medicine | Admitting: Emergency Medicine

## 2012-10-27 DIAGNOSIS — Z9889 Other specified postprocedural states: Secondary | ICD-10-CM | POA: Insufficient documentation

## 2012-10-27 DIAGNOSIS — F1410 Cocaine abuse, uncomplicated: Secondary | ICD-10-CM | POA: Insufficient documentation

## 2012-10-27 DIAGNOSIS — F1010 Alcohol abuse, uncomplicated: Secondary | ICD-10-CM

## 2012-10-27 DIAGNOSIS — R45850 Homicidal ideations: Secondary | ICD-10-CM | POA: Insufficient documentation

## 2012-10-27 DIAGNOSIS — R45851 Suicidal ideations: Secondary | ICD-10-CM | POA: Insufficient documentation

## 2012-10-27 DIAGNOSIS — F319 Bipolar disorder, unspecified: Secondary | ICD-10-CM | POA: Insufficient documentation

## 2012-10-27 DIAGNOSIS — Z79899 Other long term (current) drug therapy: Secondary | ICD-10-CM | POA: Insufficient documentation

## 2012-10-27 DIAGNOSIS — Z7982 Long term (current) use of aspirin: Secondary | ICD-10-CM | POA: Insufficient documentation

## 2012-10-27 DIAGNOSIS — Z0489 Encounter for examination and observation for other specified reasons: Secondary | ICD-10-CM | POA: Insufficient documentation

## 2012-10-27 DIAGNOSIS — F411 Generalized anxiety disorder: Secondary | ICD-10-CM | POA: Insufficient documentation

## 2012-10-27 DIAGNOSIS — Z87820 Personal history of traumatic brain injury: Secondary | ICD-10-CM | POA: Insufficient documentation

## 2012-10-27 LAB — CBC
MCH: 31.8 pg (ref 26.0–34.0)
MCHC: 34.6 g/dL (ref 30.0–36.0)
MCV: 92 fL (ref 78.0–100.0)
Platelets: 284 10*3/uL (ref 150–400)
RDW: 13.4 % (ref 11.5–15.5)

## 2012-10-27 LAB — RAPID URINE DRUG SCREEN, HOSP PERFORMED
Benzodiazepines: NOT DETECTED
Cocaine: NOT DETECTED
Opiates: NOT DETECTED

## 2012-10-27 LAB — COMPREHENSIVE METABOLIC PANEL
AST: 30 U/L (ref 0–37)
Albumin: 3.7 g/dL (ref 3.5–5.2)
Calcium: 9.4 mg/dL (ref 8.4–10.5)
Creatinine, Ser: 0.88 mg/dL (ref 0.50–1.35)
GFR calc non Af Amer: >90 mL/min (ref >90)
Sodium: 134 mEq/L — ABNORMAL LOW (ref 135–145)
Total Protein: 8 g/dL (ref 6.0–8.3)

## 2012-10-27 LAB — ACETAMINOPHEN LEVEL: Acetaminophen (Tylenol), Serum: <15 ug/mL (ref 10–30)

## 2012-10-27 LAB — SALICYLATE LEVEL: Salicylate Lvl: <2 mg/dL — ABNORMAL LOW (ref 2.8–20.0)

## 2012-10-27 MED ORDER — ZOLPIDEM TARTRATE 5 MG PO TABS
5.0000 mg | ORAL_TABLET | Freq: Every evening | ORAL | Status: DC | PRN
  Administered 2012-10-28: 5 mg via ORAL
  Filled 2012-10-27: qty 1

## 2012-10-27 MED ORDER — IBUPROFEN 600 MG PO TABS: 600.0000 mg | ORAL_TABLET | Freq: Three times a day (TID) | ORAL | Status: DC | PRN

## 2012-10-27 MED ORDER — NICOTINE 21 MG/24HR TD PT24: 21.0000 mg | MEDICATED_PATCH | Freq: Every day | TRANSDERMAL | Status: DC

## 2012-10-27 MED ORDER — ONDANSETRON HCL 4 MG PO TABS: 4.0000 mg | ORAL_TABLET | Freq: Three times a day (TID) | ORAL | Status: DC | PRN

## 2012-10-27 MED ORDER — ACETAMINOPHEN 325 MG PO TABS: 650.0000 mg | ORAL_TABLET | ORAL | Status: DC | PRN

## 2012-10-27 MED ORDER — LORAZEPAM 1 MG PO TABS
1.0000 mg | ORAL_TABLET | Freq: Three times a day (TID) | ORAL | Status: DC | PRN
  Administered 2012-10-28: 1 mg via ORAL
  Filled 2012-10-27: qty 1

## 2012-10-27 NOTE — ED Provider Notes (Signed)
History    This chart was scribed for [employer]  PA by Marlin Canary. The patient was seen in room WTR4/WLPT4. Patient's care was started at 2132.  CSN: [id number]  Arrival date & time 10/27/12  2132   First MD Initiated Contact with Patient 10/27/12 2258      Chief Complaint  Patient presents with  . Medical Clearance    (Consider location/radiation/quality/duration/timing/severity/associated sxs/prior treatment) The history is provided by the EMS personnel. No language interpreter was used.   [facility] is a 42 y.o. male who presents to the Emergency Department complaining of suicidal ideations onset 2 days ago. Pt reports overdosing on xanax in an attempt to kill himself after he discovered his fiance with another man.He was brought here by the sheriff dept and given ah shot to relax and discharged the next morning. Per chart, pt did have telepsych consult and was cleared. He states went home but still having suicidal thoughts. Also states wants to harm his girlfriend. He also reports drinking vodka all day. Pt has slurred speech. Denies any medical complaints.     Past Medical History  Diagnosis Date  . Bipolar affective disorder   . Anxiety   . 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 Alcohol abuse   . Cocaine abuse     Past Surgical History  Procedure Date  . Craniectomy     pt had surgery to remove blood clot from brain     No family history on file.  History  Substance Use Topics  . Smoking status: Unknown If Ever Smoked  . Smokeless tobacco: Not on file  . Alcohol Use: 14.4 oz/week    24 Cans of beer per week     Comment: binger; 1/5 and / or case of beer       Review of Systems  Constitutional: Negative.   HENT: Negative.   Eyes: Negative.   Respiratory: Negative.   Cardiovascular: Negative.   Gastrointestinal: Negative.   Genitourinary: Negative.   Musculoskeletal: Negative.   Skin: Negative.   Neurological: Negative.   Hematological: Negative.     Psychiatric/Behavioral: Positive for behavioral problems, confusion and agitation.  All other systems reviewed and are negative.   A complete 10 system review of systems was obtained and all systems are negative except as noted in the HPI and PMH.   Allergies  Hydrocodone  Home Medications   Current Outpatient Rx  Name  Route  Sig  Dispense  Refill  . ALPRAZOLAM 2 MG PO TABS   Oral   Take 2 mg by mouth 3 (three) times daily as needed. For anxiety         . DIAZEPAM 5 MG PO TABS   Oral   Take 5 mg by mouth every 6 (six) hours as needed. For anxiety           BP 154/92  Pulse 110  Temp 98.5 F (36.9 C)  Resp 20  SpO2 96%  Physical Exam  Nursing note and vitals reviewed. Constitutional: He is oriented to person, place, and time. He appears well-developed and well-nourished.       Pt appears intoxicated  HENT:  Head: Normocephalic and atraumatic.  Eyes: Conjunctivae normal are normal.  Neck: Neck supple.  Cardiovascular: Normal rate, regular rhythm and normal heart sounds.   Pulmonary/Chest: Effort normal and breath sounds normal. No respiratory distress. He has no wheezes. He has no rales.  Neurological: He is alert and oriented to person, place, and time.  Skin: Skin is warm.  Psychiatric:       Pt is agitated. Angry.    ED Course  Procedures (including critical care time)  DIAGNOSTIC STUDIES:  Oxygen Saturation is 96% on room air, Adequate  by my interpretation.    COORDINATION OF CARE:  2300-Patient / Family / Caregiver informed of clinical course, understand medical decision-making process, and agree with plan.   Results for orders placed during the hospital encounter of 10/27/12  CBC      Component Value Range   WBC 13.3 (*) 4.0 - 10.5 K/uL   RBC 5.00  4.22 - 5.81 MIL/uL   Hemoglobin 15.9  13.0 - 17.0 g/dL   HCT 16.1  09.6 - 04.5 %   MCV 92.0  78.0 - 100.0 fL   MCH 31.8  26.0 - 34.0 pg   MCHC 34.6  30.0 - 36.0 g/dL   RDW 40.9  81.1 - 91.4 %    Platelets 284  150 - 400 K/uL  COMPREHENSIVE METABOLIC PANEL      Component Value Range   Sodium 134 (*) 135 - 145 mEq/L   Potassium 3.9  3.5 - 5.1 mEq/L   Chloride 100  96 - 112 mEq/L   CO2 21  19 - 32 mEq/L   Glucose, Bld 118 (*) 70 - 99 mg/dL   BUN 11  6 - 23 mg/dL   Creatinine, Ser 7.82  0.50 - 1.35 mg/dL   Calcium 9.4  8.4 - 95.6 mg/dL   Total Protein 8.0  6.0 - 8.3 g/dL   Albumin 3.7  3.5 - 5.2 g/dL   AST 30  0 - 37 U/L   ALT 33  0 - 53 U/L   Alkaline Phosphatase 92  39 - 117 U/L   Total Bilirubin 0.3  0.3 - 1.2 mg/dL   GFR calc non Af Amer >90  >90 mL/min   GFR calc Af Amer >90  >90 mL/min  ETHANOL      Component Value Range   Alcohol, Ethyl (B) 196 (*) 0 - 11 mg/dL  ACETAMINOPHEN LEVEL      Component Value Range   Acetaminophen (Tylenol), Serum <15.0  10 - 30 ug/mL  SALICYLATE LEVEL      Component Value Range   Salicylate Lvl <2.0 (*) 2.8 - 20.0 mg/dL  URINE RAPID DRUG SCREEN (HOSP PERFORMED)      Component Value Range   Opiates NONE DETECTED  NONE DETECTED   Cocaine NONE DETECTED  NONE DETECTED   Benzodiazepines NONE DETECTED  NONE DETECTED   Amphetamines NONE DETECTED  NONE DETECTED   Tetrahydrocannabinol NONE DETECTED  NONE DETECTED   Barbiturates NONE DETECTED  NONE DETECTED   11:30PM Pt now became agitated, sates that he wants to leave. I do not feel like pt is stable mentally to leave at this time due to his intoxication and suicidal ideations. Pt states he is no longer suicidal, however, states he is saying that because he wants to leave. Pt involuntary committed. Will get telepsych.    2:07 AM Pt had tele psych consult. Per Dr.Penalver's evaluation, pt OK to be discharged home. Pt denied being suicidal or homicidal to him. Will d/c home. Note this is the 2nd time pt comes in suicidal, and ends up denying it   1. Alcohol abuse       MDM  Pt came in stating he was suicidal. He was IVCed because wanted to leave. Papers taken out. Pt was seen by  telepsychiatrist, and was cleared for d/c. Pt no longer stating he is suicidal, attributing to his alcohol level being high. Pt will be d/c home based on recommendations. Instructed to stop drinking and see a psychiatrist outpatient.   Filed Vitals:   10/28/12 0016  BP: 155/99  Pulse: 102  Temp: 98.4 F (36.9 C)  Resp: 20      I personally performed the services described in this documentation, which was scribed in my presence. The recorded information has been reviewed and is accurate.      Lottie Mussel, PA 10/28/12 0214  Lottie Mussel, PA 10/28/12 0222

## 2012-10-27 NOTE — ED Notes (Signed)
Two patient belonging bags locked in locker 34

## 2012-10-27 NOTE — ED Notes (Signed)
Pt states on Saturday he overdosed on xanax in attempt to kill himself because his girlfriend cheated on him; states he was brought here and they gave him a shot to relax and discharged him home the next morning; he states he needs help because he wants to kill himself; states has been drinking vodka all day; speech slurred; pt brought in by sheriff dept and dropped off;

## 2012-10-28 MED ORDER — ALUM & MAG HYDROXIDE-SIMETH 200-200-20 MG/5ML PO SUSP
30.0000 mL | ORAL | Status: DC | PRN
Start: 2012-10-28 — End: 2012-10-28
  Administered 2012-10-28: 30 mL via ORAL
  Filled 2012-10-28: qty 30

## 2012-10-28 NOTE — ED Notes (Addendum)
Pt transferred from triage, IVC pt presents for medical clearance requesting alcohol detox. Pt SI, states he ingested 65 Xanax 2 days ago and presented here for eval.  Pt denies SI or HI at present.  Smell of ETOH noted.  Pt slightly anxious but cooperative.  Pt reports he found his girlfriend cheating on him and started drinking after not drinking for the past year.

## 2012-10-28 NOTE — ED Provider Notes (Signed)
Medical screening examination/treatment/procedure(s) were performed by non-physician practitioner and as supervising physician I was immediately available for consultation/collaboration.   Amparo Donalson L Mikhaila Roh, MD 10/28/12 0527 

## 2013-01-19 ENCOUNTER — Emergency Department (HOSPITAL_COMMUNITY)
Admission: EM | Admit: 2013-01-19 | Discharge: 2013-01-19 | Disposition: A | Discharge status: Home / Self Care | Payer: MEDICAID | Attending: Emergency Medicine | Admitting: Emergency Medicine

## 2013-01-19 ENCOUNTER — Encounter (HOSPITAL_COMMUNITY): Payer: Self-pay | Admitting: *Deleted

## 2013-01-19 DIAGNOSIS — Z87828 Personal history of other (healed) physical injury and trauma: Secondary | ICD-10-CM | POA: Insufficient documentation

## 2013-01-19 DIAGNOSIS — F1010 Alcohol abuse, uncomplicated: Secondary | ICD-10-CM | POA: Insufficient documentation

## 2013-01-19 DIAGNOSIS — F411 Generalized anxiety disorder: Secondary | ICD-10-CM | POA: Insufficient documentation

## 2013-01-19 DIAGNOSIS — F319 Bipolar disorder, unspecified: Secondary | ICD-10-CM | POA: Insufficient documentation

## 2013-01-19 DIAGNOSIS — F1410 Cocaine abuse, uncomplicated: Secondary | ICD-10-CM | POA: Insufficient documentation

## 2013-01-19 DIAGNOSIS — Z79899 Other long term (current) drug therapy: Secondary | ICD-10-CM | POA: Insufficient documentation

## 2013-01-19 LAB — RAPID URINE DRUG SCREEN, HOSP PERFORMED
Cocaine: POSITIVE — AB
Opiates: NOT DETECTED
Tetrahydrocannabinol: NOT DETECTED

## 2013-01-19 LAB — CBC WITH DIFFERENTIAL/PLATELET
Basophils Relative: 0 % (ref 0–1)
Eosinophils Absolute: 0.1 10*3/uL (ref 0.0–0.7)
HCT: 49 % (ref 39.0–52.0)
Hemoglobin: 17.3 g/dL — ABNORMAL HIGH (ref 13.0–17.0)
Lymphs Abs: 2.7 10*3/uL (ref 0.7–4.0)
MCH: 32.9 pg (ref 26.0–34.0)
MCHC: 35.3 g/dL (ref 30.0–36.0)
Monocytes Absolute: 0.9 10*3/uL (ref 0.1–1.0)
Monocytes Relative: 8 % (ref 3–12)
RBC: 5.26 MIL/uL (ref 4.22–5.81)

## 2013-01-19 LAB — BASIC METABOLIC PANEL
BUN: 15 mg/dL (ref 6–23)
CO2: 28 mEq/L (ref 19–32)
Chloride: 96 mEq/L (ref 96–112)
Glucose, Bld: 89 mg/dL (ref 70–99)
Potassium: 3.6 mEq/L (ref 3.5–5.1)

## 2013-01-19 MED ORDER — LORAZEPAM 1 MG PO TABS
1.0000 mg | ORAL_TABLET | Freq: Once | ORAL | Status: AC
  Administered 2013-01-19: 1 mg via ORAL
  Filled 2013-01-19: qty 1

## 2013-01-19 NOTE — ED Notes (Signed)
Etoh and cocaine over week end  Last drank last night.  Had stopped  etoh and cocaine for 4 mos prior to this weekend.  Alert, cooperative

## 2013-01-19 NOTE — ED Provider Notes (Signed)
History     CSN: [id number]  Arrival date & time 01/19/13  1720   First MD Initiated Contact with Patient 01/19/13 1739      Chief Complaint  Patient presents with  . V70.1     HPI Pt was seen at 1800.   Per pt, c/o gradual onset and persistence of constant etoh and cocaine abuse for the past 3 to 4 days.  Pt states he was "sober for 4 months" before he resumed drinking and using cocaine this past weekend.  LD etoh yesterday.  LD cocaine 2 days ago.  States he is here today requesting detox from etoh.  Denies SI, no HI, no hallucinations. Denies tremors, denies DT's history.     Past Medical History  Diagnosis Date  . Bipolar affective disorder   . Anxiety   . 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 Alcohol abuse   . Cocaine abuse     Past Surgical History  Procedure Laterality Date  . Craniectomy      pt had surgery to remove blood clot from brain     History  Substance Use Topics  . Smoking status: Never Smoker   . Smokeless tobacco: Not on file  . Alcohol Use: 14.4 oz/week    24 Cans of beer per week     Comment: binger; 1/5 and / or case of beer       Review of Systems ROS: Statement: All systems negative except as marked or noted in the HPI; Constitutional: Negative for fever and chills. ; ; Eyes: Negative for eye pain, redness and discharge. ; ; ENMT: Negative for ear pain, hoarseness, nasal congestion, sinus pressure and sore throat. ; ; Cardiovascular: Negative for chest pain, palpitations, diaphoresis, dyspnea and peripheral edema. ; ; Respiratory: Negative for cough, wheezing and stridor. ; ; Gastrointestinal: Negative for nausea, vomiting, diarrhea, abdominal pain, blood in stool, hematemesis, jaundice and rectal bleeding. . ; ; Genitourinary: Negative for dysuria, flank pain and hematuria. ; ; Musculoskeletal: Negative for back pain and neck pain. Negative for swelling and trauma.; ; Skin: Negative for pruritus, rash, abrasions, blisters, bruising and skin lesion.; ; Neuro:  Negative for headache, lightheadedness and neck stiffness. Negative for weakness, altered level of consciousness , altered mental status, extremity weakness, paresthesias, involuntary movement, seizure and syncope.; Psych:  No SI, no SA, no HI, no hallucinations.        Allergies  Hydrocodone  Home Medications   Current Outpatient Rx  Name  Route  Sig  Dispense  Refill  . alprazolam (XANAX) 2 MG tablet   Oral   Take 2 mg by mouth 3 (three) times daily as needed. For anxiety         . diazepam (VALIUM) 5 MG tablet   Oral   Take 5 mg by mouth every 6 (six) hours as needed. For anxiety           BP 156/103  Pulse 76  Temp(Src) 98.2 F (36.8 C) (Oral)  Resp 18  Ht 5\' 11"  (1.803 m)  Wt 271 lb (122.925 kg)  BMI 37.81 kg/m2  SpO2 99%  Physical Exam 1805: Physical examination:  Nursing notes reviewed; Vital signs and O2 SAT reviewed;  Constitutional: Well developed, Well nourished, Well hydrated, In no acute distress; Head:  Normocephalic, atraumatic; Eyes: EOMI, PERRL, No scleral icterus; ENMT: Mouth and pharynx normal, Mucous membranes moist; Neck: Supple, Full range of motion, No lymphadenopathy; Cardiovascular: Regular rate and rhythm, No murmur, rub, or gallop; Respiratory: Breath  sounds clear & equal bilaterally, No rales, rhonchi, wheezes.  Speaking full sentences with ease, Normal respiratory effort/excursion; Chest: Nontender, Movement normal; Abdomen: Soft, Nontender, Nondistended, Normal bowel sounds; Genitourinary: No CVA tenderness; Extremities: Pulses normal, No tenderness, No edema, No calf edema or asymmetry.; Neuro: AA&Ox3, Major CN grossly intact.  Speech clear. No gross focal motor or sensory deficits in extremities.; Skin: Color normal, Warm, Dry.; Psych:  No SI, no hallucinations.    ED Course  Procedures    MDM  MDM Reviewed: previous chart, nursing note and vitals Interpretation: labs   Results for orders placed during the hospital encounter of  01/19/13  URINE RAPID DRUG SCREEN (HOSP PERFORMED)      Result Value Range   Opiates NONE DETECTED  NONE DETECTED   Cocaine POSITIVE (*) NONE DETECTED   Benzodiazepines NONE DETECTED  NONE DETECTED   Amphetamines NONE DETECTED  NONE DETECTED   Tetrahydrocannabinol NONE DETECTED  NONE DETECTED   Barbiturates NONE DETECTED  NONE DETECTED  ETHANOL      Result Value Range   Alcohol, Ethyl (B) <11  0 - 11 mg/dL  BASIC METABOLIC PANEL      Result Value Range   Sodium 137  135 - 145 mEq/L   Potassium 3.6  3.5 - 5.1 mEq/L   Chloride 96  96 - 112 mEq/L   CO2 28  19 - 32 mEq/L   Glucose, Bld 89  70 - 99 mg/dL   BUN 15  6 - 23 mg/dL   Creatinine, Ser 1.61  0.50 - 1.35 mg/dL   Calcium 9.6  8.4 - 09.6 mg/dL   GFR calc non Af Amer 71 (*) >90 mL/min   GFR calc Af Amer 82 (*) >90 mL/min  CBC WITH DIFFERENTIAL      Result Value Range   WBC 11.1 (*) 4.0 - 10.5 K/uL   RBC 5.26  4.22 - 5.81 MIL/uL   Hemoglobin 17.3 (*) 13.0 - 17.0 g/dL   HCT 04.5  40.9 - 81.1 %   MCV 93.2  78.0 - 100.0 fL   MCH 32.9  26.0 - 34.0 pg   MCHC 35.3  30.0 - 36.0 g/dL   RDW 91.4  78.2 - 95.6 %   Platelets 258  150 - 400 K/uL   Neutrophils Relative 67  43 - 77 %   Neutro Abs 7.4  1.7 - 7.7 K/uL   Lymphocytes Relative 24  12 - 46 %   Lymphs Abs 2.7  0.7 - 4.0 K/uL   Monocytes Relative 8  3 - 12 %   Monocytes Absolute 0.9  0.1 - 1.0 K/uL   Eosinophils Relative 1  0 - 5 %   Eosinophils Absolute 0.1  0.0 - 0.7 K/uL   Basophils Relative 0  0 - 1 %   Basophils Absolute 0.0  0.0 - 0.1 K/uL     2015:  T/C to [facility]: Samson Frederic states pt does not meet criteria for inpt etoh detox at this time, discharge pt with outpt resources. Pt continues calm/cooperative, NAD, watching TV most of his ED visit, no outward signs of active withdrawal. Continues to deny SI. Tol PO well without N/V.  Dx and testing d/w pt.  Questions answered.  Verb understanding, agreeable to d/c home with outpt f/u.           Laray Anger,  DO 01/21/13 1637

## 2013-02-09 ENCOUNTER — Emergency Department: Payer: Self-pay | Admitting: Emergency Medicine

## 2013-02-09 LAB — COMPREHENSIVE METABOLIC PANEL
Albumin: 4.1 g/dL (ref 3.4–5.0)
Alkaline Phosphatase: 106 U/L (ref 50–136)
BUN: 12 mg/dL (ref 7–18)
Bilirubin,Total: 0.2 mg/dL (ref 0.2–1.0)
Calcium, Total: 8.1 mg/dL — ABNORMAL LOW (ref 8.5–10.1)
Creatinine: 1.05 mg/dL (ref 0.60–1.30)
EGFR (African American): >60
Osmolality: 283 (ref 275–301)
Potassium: 3.8 mmol/L (ref 3.5–5.1)
SGPT (ALT): 59 U/L (ref 12–78)
Total Protein: 8.3 g/dL — ABNORMAL HIGH (ref 6.4–8.2)

## 2013-02-09 LAB — CBC
HCT: 51.8 % (ref 40.0–52.0)
HGB: 17.6 g/dL (ref 13.0–18.0)
MCHC: 34.1 g/dL (ref 32.0–36.0)
MCV: 95 fL (ref 80–100)
RBC: 5.43 10*6/uL (ref 4.40–5.90)
RDW: 14.8 % — ABNORMAL HIGH (ref 11.5–14.5)
WBC: 7.3 10*3/uL (ref 3.8–10.6)

## 2013-02-09 LAB — DRUG SCREEN, URINE
Amphetamines, Ur Screen: POSITIVE (ref ?–1000)
Benzodiazepine, Ur Scrn: NEGATIVE (ref ?–200)
Cocaine Metabolite,Ur ~~LOC~~: NEGATIVE (ref ?–300)
MDMA (Ecstasy)Ur Screen: NEGATIVE (ref ?–500)
Methadone, Ur Screen: NEGATIVE (ref ?–300)
Phencyclidine (PCP) Ur S: NEGATIVE (ref ?–25)

## 2013-02-09 LAB — SALICYLATE LEVEL: Salicylates, Serum: <1.7 mg/dL

## 2013-02-09 LAB — ETHANOL
Ethanol %: 0.293 % — ABNORMAL HIGH (ref 0.000–0.080)
Ethanol: 293 mg/dL

## 2013-02-09 LAB — TSH: Thyroid Stimulating Horm: 1.73 u[IU]/mL

## 2013-02-10 LAB — URINALYSIS, COMPLETE
Bacteria: NONE SEEN
Bilirubin,UR: NEGATIVE
Blood: NEGATIVE
Glucose,UR: NEGATIVE mg/dL (ref 0–75)
Ketone: NEGATIVE
Leukocyte Esterase: NEGATIVE
Nitrite: NEGATIVE
Ph: 5 (ref 4.5–8.0)
Protein: NEGATIVE
RBC,UR: 1 /HPF (ref 0–5)
Specific Gravity: 1.012 (ref 1.003–1.030)
Squamous Epithelial: NONE SEEN
WBC UR: <1 /HPF (ref 0–5)

## 2013-03-07 ENCOUNTER — Emergency Department (HOSPITAL_COMMUNITY)
Admission: EM | Admit: 2013-03-07 | Discharge: 2013-03-08 | Disposition: A | Discharge status: Home / Self Care | Payer: Medicaid Other | Attending: Emergency Medicine | Admitting: Emergency Medicine

## 2013-03-07 ENCOUNTER — Encounter (HOSPITAL_COMMUNITY): Payer: Self-pay | Admitting: *Deleted

## 2013-03-07 ENCOUNTER — Other Ambulatory Visit: Payer: Self-pay

## 2013-03-07 DIAGNOSIS — F319 Bipolar disorder, unspecified: Secondary | ICD-10-CM | POA: Insufficient documentation

## 2013-03-07 DIAGNOSIS — F10929 Alcohol use, unspecified with intoxication, unspecified: Secondary | ICD-10-CM

## 2013-03-07 DIAGNOSIS — Z79899 Other long term (current) drug therapy: Secondary | ICD-10-CM | POA: Insufficient documentation

## 2013-03-07 DIAGNOSIS — F411 Generalized anxiety disorder: Secondary | ICD-10-CM | POA: Insufficient documentation

## 2013-03-07 DIAGNOSIS — F1410 Cocaine abuse, uncomplicated: Secondary | ICD-10-CM | POA: Insufficient documentation

## 2013-03-07 DIAGNOSIS — T424X4A Poisoning by benzodiazepines, undetermined, initial encounter: Secondary | ICD-10-CM | POA: Insufficient documentation

## 2013-03-07 DIAGNOSIS — F1010 Alcohol abuse, uncomplicated: Secondary | ICD-10-CM | POA: Insufficient documentation

## 2013-03-07 DIAGNOSIS — T438X2A Poisoning by other psychotropic drugs, intentional self-harm, initial encounter: Secondary | ICD-10-CM | POA: Insufficient documentation

## 2013-03-07 DIAGNOSIS — Z87828 Personal history of other (healed) physical injury and trauma: Secondary | ICD-10-CM | POA: Insufficient documentation

## 2013-03-07 DIAGNOSIS — T43502A Poisoning by unspecified antipsychotics and neuroleptics, intentional self-harm, initial encounter: Secondary | ICD-10-CM | POA: Insufficient documentation

## 2013-03-07 DIAGNOSIS — T43204A Poisoning by unspecified antidepressants, undetermined, initial encounter: Secondary | ICD-10-CM | POA: Insufficient documentation

## 2013-03-07 LAB — SALICYLATE LEVEL: Salicylate Lvl: <2 mg/dL — ABNORMAL LOW (ref 2.8–20.0)

## 2013-03-07 LAB — RAPID URINE DRUG SCREEN, HOSP PERFORMED
Amphetamines: NOT DETECTED
Barbiturates: NOT DETECTED
Benzodiazepines: POSITIVE — AB
Tetrahydrocannabinol: NOT DETECTED

## 2013-03-07 LAB — URINALYSIS, ROUTINE W REFLEX MICROSCOPIC
Glucose, UA: NEGATIVE mg/dL
Ketones, ur: NEGATIVE mg/dL
Leukocytes, UA: NEGATIVE
Nitrite: NEGATIVE
Specific Gravity, Urine: 1.01 (ref 1.005–1.030)
pH: 5.5 (ref 5.0–8.0)

## 2013-03-07 LAB — CBC WITH DIFFERENTIAL/PLATELET
Basophils Relative: 0 % (ref 0–1)
Eosinophils Absolute: 0.1 10*3/uL (ref 0.0–0.7)
Eosinophils Relative: 1 % (ref 0–5)
Hemoglobin: 16.9 g/dL (ref 13.0–17.0)
MCH: 32.8 pg (ref 26.0–34.0)
MCHC: 34.8 g/dL (ref 30.0–36.0)
MCV: 94.4 fL (ref 78.0–100.0)
Monocytes Relative: 12 % (ref 3–12)
Neutrophils Relative %: 57 % (ref 43–77)
Platelets: 234 10*3/uL (ref 150–400)

## 2013-03-07 LAB — COMPREHENSIVE METABOLIC PANEL
Albumin: 4.1 g/dL (ref 3.5–5.2)
Alkaline Phosphatase: 100 U/L (ref 39–117)
BUN: 13 mg/dL (ref 6–23)
Calcium: 8.8 mg/dL (ref 8.4–10.5)
GFR calc Af Amer: >90 mL/min (ref >90)
Glucose, Bld: 90 mg/dL (ref 70–99)
Potassium: 3.7 mEq/L (ref 3.5–5.1)
Total Protein: 7.9 g/dL (ref 6.0–8.3)

## 2013-03-07 LAB — ETHANOL: Alcohol, Ethyl (B): 279 mg/dL — ABNORMAL HIGH (ref 0–11)

## 2013-03-07 LAB — AMMONIA: Ammonia: 40 umol/L (ref 11–60)

## 2013-03-07 MED ORDER — SODIUM CHLORIDE 0.9 % IV SOLN
INTRAVENOUS | Status: DC
  Administered 2013-03-07: 18:00:00 via INTRAVENOUS

## 2013-03-07 NOTE — ED Provider Notes (Signed)
History     CSN: [id number]  Arrival date & time 03/07/13  1705   First MD Initiated Contact with Patient 03/07/13 1708      Chief Complaint  Patient presents with  . Drug Overdose    (Consider location/radiation/quality/duration/timing/severity/associated sxs/prior treatment) HPI Comments: Gary Stanley is a 42 y.o. Male transferred by EMS for evaluation of overdose. Is not clear who called EMS. EMS noted to be a candidate and transferred him here. They report that he ingested Xanax and Remeron in an effort to kill himself. There is no additional information available. The patient cannot give history.  Level V caveat-altered mental status   Patient is a 42 y.o. male presenting with Overdose. The history is provided by the patient.  Drug Overdose    Past Medical History  Diagnosis Date  . Bipolar affective disorder   . Anxiety   . 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 Alcohol abuse   . Cocaine abuse     Past Surgical History  Procedure Laterality Date  . Craniectomy      pt had surgery to remove blood clot from brain     No family history on file.  History  Substance Use Topics  . Smoking status: Never Smoker   . Smokeless tobacco: Not on file  . Alcohol Use: 14.4 oz/week    24 Cans of beer per week     Comment: binger; 1/5 and / or case of beer       Review of Systems  Unable to perform ROS   Allergies  Hydrocodone  Home Medications   Current Outpatient Rx  Name  Route  Sig  Dispense  Refill  . ALPRAZolam (XANAX) 1 MG tablet   Oral   Take 1 mg by mouth at bedtime as needed for sleep.         . diazepam (VALIUM) 10 MG tablet   Oral   Take 10 mg by mouth every 6 (six) hours as needed for anxiety.         . mirtazapine (REMERON) 15 MG tablet   Oral   Take 15 mg by mouth at bedtime.           BP 146/83  Pulse 94  Temp(Src) 97.7 F (36.5 C) (Oral)  Resp 23  SpO2 97%  Physical Exam  Nursing note and vitals reviewed. Constitutional: He appears  well-developed.  Disheveled, obese  HENT:  Head: Normocephalic and atraumatic.  Right Ear: External ear normal.  Left Ear: External ear normal.  Eyes: Conjunctivae and EOM are normal. Pupils are equal, round, and reactive to light.  Neck: Normal range of motion and phonation normal. Neck supple.  Cardiovascular: Normal rate, regular rhythm, normal heart sounds and intact distal pulses.   Pulmonary/Chest: Effort normal. He exhibits no bony tenderness.  Scattered rhonchi  Abdominal: Soft. Normal appearance. There is no tenderness.  Musculoskeletal: Normal range of motion.  Neurological: He is alert. He has normal strength. No cranial nerve deficit or sensory deficit. He exhibits normal muscle tone. Coordination normal.  Dysarthric consistent with intoxication. No receptive or expressive aphasia. In response to questions in a tangential fashion.  Skin: Skin is warm, dry and intact.  Psychiatric:  Appears depressed    ED Course  Procedures (including critical care time)  Poison control was contacted and recommended observation and supportive care with usual evaluation, for overdose.  19:45- reevaluation: Sonorous respirations. Vital signs normal, nasal trumpet still in place.   20:40- reevaluation: he continues to  have sonorous respirations. Vital signs are normal including oxygenation   Patient Vitals for the past 24 hrs:  BP Temp Temp src Pulse Resp SpO2  03/07/13 1845 146/83 mmHg - - 94 23 97 %  03/07/13 1830 144/71 mmHg - - 95 25 96 %  03/07/13 1815 146/87 mmHg - - 95 25 95 %  03/07/13 1800 141/85 mmHg - - 96 24 96 %  03/07/13 1745 124/77 mmHg - - 92 24 96 %  03/07/13 1736 131/83 mmHg - - 90 21 98 %  03/07/13 1718 - - - - - 97 %  03/07/13 1715 - 97.7 F (36.5 C) Oral - - 95 %  03/07/13 1710 130/77 mmHg - - 89 20 -     Date: 03/07/13  Rate: 88  Rhythm: normal sinus rhythm  QRS Axis: normal  PR and QT Intervals: normal  ST/T Wave abnormalities: normal  PR and QRS  Conduction Disutrbances:none  Narrative Interpretation:   Old EKG Reviewed: unchanged- 10/23/12  CRITICAL CARE Performed by: Mancel Bale L Total critical care time: 40 minutes Critical care time was exclusive of separately billable procedures and treating other patients. Critical care was necessary to treat or prevent imminent or life-threatening deterioration. Critical care was time spent personally by me on the following activities: development of treatment plan with patient and/or surrogate as well as nursing, discussions with consultants, evaluation of patient's response to treatment, examination of patient, obtaining history from patient or surrogate, ordering and performing treatments and interventions, ordering and review of laboratory studies, ordering and review of radiographic studies, pulse oximetry and re-evaluation of patient's condition.  Labs Reviewed  COMPREHENSIVE METABOLIC PANEL - Abnormal; Notable for the following:    GFR calc non Af Amer 87 (*)    All other components within normal limits  URINALYSIS, ROUTINE W REFLEX MICROSCOPIC - Abnormal; Notable for the following:    Color, Urine STRAW (*)    All other components within normal limits  ETHANOL - Abnormal; Notable for the following:    Alcohol, Ethyl (B) 279 (*)    All other components within normal limits  URINE RAPID DRUG SCREEN (HOSP PERFORMED) - Abnormal; Notable for the following:    Cocaine POSITIVE (*)    Benzodiazepines POSITIVE (*)    All other components within normal limits  SALICYLATE LEVEL - Abnormal; Notable for the following:    Salicylate Lvl <2.0 (*)    All other components within normal limits  URINE CULTURE  CBC WITH DIFFERENTIAL  AMMONIA  ACETAMINOPHEN LEVEL      1. Overdose, initial encounter   2. Alcohol intoxication       MDM  Apparent intentional overdose with Xanax and Remeron. Associated significant alcohol intoxication. Patient will have to becomes sober prior to assessing  his mental status. He will require 6 hours observation to be medically cleared; clearance time 23:00 on 03/07/13. The time of exposure, to the overdose, is unknown. I doubt any additional toxic exposures.   Nursing Notes Reviewed/ Care Coordinated, and agree without changes. Applicable Imaging Reviewed.  Interpretation of Laboratory Data incorporated into ED treatment  Care to oncoming treatment team to evaluate once sober. He will likely need to be assessed by the psychiatric team..        Flint Melter, MD 03/07/13 2219

## 2013-03-07 NOTE — ED Notes (Signed)
Brought in via [facility] for drug overdose, speech slurred, took unknown drugs and etoh intake

## 2013-03-07 NOTE — ED Notes (Signed)
Pt did have nasal airway present, pt has since removed it on his own.

## 2013-03-07 NOTE — ED Notes (Addendum)
[facility] in to see patient at this time

## 2013-03-07 NOTE — ED Notes (Signed)
Pt states that he wants help with his alcohol addiction. I asked the patient if when he drank and took the pills earlier if he was trying to hurt himself and he states that he was not trying to hurt himself. Imformed pt that I would make EDP aware of this and that we could possibly refer him to somewhere to get help.

## 2013-03-07 NOTE — ED Notes (Signed)
Spoke with patients father regarding situation, pts father states that he has had issues with taking benzos and mixing them with alcohol. States he believes that is what he did this time.

## 2013-03-07 NOTE — ED Notes (Addendum)
Spoke with poison control regarding pt, they were requesting follow up info, advised reassessment of patient by MD at midnight.

## 2013-03-07 NOTE — ED Notes (Addendum)
o2 sats drop to 88% on room air periodically as patient snores. Placed on 2L [place] and o2 sats now 95%

## 2013-03-07 NOTE — ED Notes (Signed)
Poison control contacted. Spoke with [place]. She recommends ekg, acetaminophen level, LFTs, and to monitor for 6 hours. Biggest concern is respiratory depression. MD made aware of recomendations.

## 2013-03-07 NOTE — ED Notes (Signed)
Pt now refusing o2 via [place], o2 sats 95% and periodically drop to 89% while asleep.

## 2013-03-07 NOTE — ED Notes (Signed)
Pt awake, speaking in a slurred manner, states he either wants food or he wants to go home. Informed him I would not give him food because he is unable to adequately clear his secretions and is needing to be suctioned PRN. Pt is able to cough and clear secretions with prompting, Airway clear and patent.

## 2013-03-08 NOTE — ED Notes (Signed)
Spoke with dr strand and she states pt is stable for discharge and is to follow up with [facility]. Pt states he has no ride tonight, informed him he may stay until the AM when his ride arrives.

## 2013-03-08 NOTE — ED Provider Notes (Signed)
Patient evaluated by [facility].  He tells them that he is not suicidal and he did not attempt to harm himself.  He reports that he started drinking again due to the fact that there was a death in the family he was upset about.  Will not pursue involuntary commitment at this time.  He will be allowed to rest in the exam room and will be reassessed when fully sober in the AM.    Geoffery Lyons, MD 03/08/13 [phone]

## 2013-03-08 NOTE — BH Assessment (Signed)
Assessment Note   Gary Stanley is an 42 y.o. male. Pt brought in by EMS with report of alcohol use and a drug overdose.  Pt is now fairly coherent but still slurring somewhat.  Pt reports that he has history of alcohol problems but has been mostly sober over the past 9 months.  Pt reports he started looking at pictures of his nephew, who was raised with him and who died of a drug overdose 3 years ago.  Pt began looking at pictures 3 days ago and began drinking--ended up with 3 day binge, 24 beers per day plus liquor.  Pt also used cocaine today.  Pt very remorseful at this time, reports he "does not want to go back to that" (drinking).  Pt denies ongoing depression.  Pt adamantly denies any SI.  Pt does admit that he took his prescribed remeron today, one pill, and also took 4 mg of xanax.  Pt denies this was intentional overdose.  Pt denies any HI/AV as well.  Pt is sweating and having cold chills currently.  Uncertain as far as his level of withdrawals at this time.  Axis I: alcohol dependence Axis II: Deferred Axis III:  Past Medical History  Diagnosis Date  . Bipolar affective disorder   . Anxiety   . 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 Alcohol abuse   . Cocaine abuse    Axis IV: grief and loss Axis V: 51-60 moderate symptoms  Past Medical History:  Past Medical History  Diagnosis Date  . Bipolar affective disorder   . Anxiety   . 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 Alcohol abuse   . Cocaine abuse     Past Surgical History  Procedure Laterality Date  . Craniectomy      pt had surgery to remove blood clot from brain     Family History: No family history on file.  Social History:  reports that he has never smoked. He does not have any smokeless tobacco history on file. He reports that he drinks about 14.4 ounces of alcohol per week. He reports that he uses illicit drugs ("Crack" cocaine and Cocaine) about 3 times per week.  Additional Social History:  Alcohol / Drug Use Pain Medications: Pt  denies Prescriptions: Pt denies Over the Counter: Pt denies History of alcohol / drug use?: Yes Longest period of sobriety (when/how long): pt has been mostly sober past nine months--ED note refers to another binge in April Negative Consequences of Use: Financial;Legal;Personal relationships Withdrawal Symptoms: Sweats Substance #1 Name of Substance 1: alcohol 1 - Age of First Use: 16 1 - Amount (size/oz): 24 beers plus liquor 1 - Frequency: past 3 days only 1 - Last Use / Amount: 5/17 24 beers, 1/2 gallon liquor Substance #2 Name of Substance 2: cocaine 2 - Age of First Use: mid 20's 2 - Amount (size/oz): Pt denies regular use 2 - Last Use / Amount: 5/17, 2 hits  CIWA: CIWA-Ar BP: 122/66 mmHg Pulse Rate: 98 Nausea and Vomiting: no nausea and no vomiting Tactile Disturbances: none Tremor: no tremor Auditory Disturbances: not present Paroxysmal Sweats: three Visual Disturbances: not present Anxiety: moderately anxious, or guarded, so anxiety is inferred Headache, Fullness in Head: none present Agitation: normal activity Orientation and Clouding of Sensorium: oriented and can do serial additions CIWA-Ar Total: 7 COWS:    Allergies:  Allergies  Allergen Reactions  . Hydrocodone Itching and Nausea Only    Home Medications:  (Not in a hospital admission)  OB/GYN Status:  No LMP for male patient.  General Assessment Data Location of Assessment: [facility] Assessment: Yes Living Arrangements: Parent Can pt return to current living arrangement?: Yes     Risk to self Suicidal Ideation: No Suicidal Intent: No Is patient at risk for suicide?: No Suicidal Plan?: No Access to Means: No What has been your use of drugs/alcohol within the last 12 months?: binge use Previous Attempts/Gestures: No Intentional Self Injurious Behavior: None Family Suicide History: No Recent stressful life event(s): Loss (Comment) (nephew died of drug overdose 3 years ago-still  grieving) Persecutory voices/beliefs?: No Depression: No Substance abuse history and/or treatment for substance abuse?: Yes  Risk to Others Homicidal Ideation: No Thoughts of Harm to Others: No Current Homicidal Intent: No Current Homicidal Plan: No Access to Homicidal Means: No History of harm to others?: No Assessment of Violence: None Noted Does patient have access to weapons?: No Criminal Charges Pending?: No Does patient have a court date: No  Psychosis Hallucinations: None noted Delusions: None noted  Mental Status Report Appear/Hygiene: Disheveled Eye Contact: Fair Motor Activity: Unremarkable Speech: Slurred Level of Consciousness: Crying;Alert Mood: Sad Affect: Appropriate to circumstance Anxiety Level: Moderate Thought Processes: Coherent;Relevant Judgement: Unimpaired Orientation: Person;Place;Time;Situation Obsessive Compulsive Thoughts/Behaviors: None  Cognitive Functioning Concentration: Normal Memory: Recent Intact;Remote Intact IQ: Average Insight: Good Impulse Control: Fair Appetite: Good Weight Loss: 0 Weight Gain: 0 Sleep: No Change Vegetative Symptoms: None  ADLScreening [facility]) Patient's cognitive ability adequate to safely complete daily activities?: Yes Patient able to express need for assistance with ADLs?: Yes Independently performs ADLs?: Yes (appropriate for developmental age)  Abuse/Neglect [facility]) Physical Abuse: Denies Verbal Abuse: Denies Sexual Abuse: Denies  Prior Inpatient Therapy Prior Inpatient Therapy: Yes ([facility] 2010) Prior Therapy Dates: 2012 Prior Therapy Facilty/Provider(s): [facility] Reason for Treatment: detox  Prior Outpatient Therapy Prior Outpatient Therapy: Yes Prior Therapy Dates: current Prior Therapy Facilty/Provider(s): [facility] [place] Reason for Treatment: meds  ADL Screening (condition at time of admission) Patient's cognitive ability adequate to safely complete daily  activities?: Yes Patient able to express need for assistance with ADLs?: Yes Independently performs ADLs?: Yes (appropriate for developmental age) Weakness of Legs: None Weakness of Arms/Hands: None       Abuse/Neglect Assessment (Assessment to be complete while patient is alone) Physical Abuse: Denies Verbal Abuse: Denies Sexual Abuse: Denies Exploitation of patient/patient's resources: Denies Self-Neglect: Denies     Merchant navy officer (For Healthcare) Advance Directive: Patient does not have advance directive;Patient would not like information    Additional Information 1:1 In Past 12 Months?: No CIRT Risk: No Elopement Risk: No Does patient have medical clearance?: Yes     Disposition: Discussed that pt denies any SI/HI.  Pt still appears to be somewhat intoxicated, will let him stay overnight to make sure regarding disposition in AM. Disposition Initial Assessment Completed for this Encounter: Yes Disposition of Patient: Other dispositions Other disposition(s):  (uncertain at this time)  On Site Evaluation by:   Reviewed with Physician:     Lorri Frederick 03/08/2013 12:33 AM

## 2013-03-08 NOTE — ED Notes (Signed)
Pt sitting up in bed at this time, pt is aox4 at current, speaking coherently. Pt was given some water and tolerated well. Pt attempting to urinate using a urinal at this time.

## 2013-03-08 NOTE — ED Notes (Signed)
Pt attempting to contact family member to come get him.

## 2013-03-08 NOTE — ED Notes (Signed)
Pt able to ambulate without difficulty and independently. Pt is changing at this time.

## 2013-03-09 LAB — URINE CULTURE: Colony Count: NO GROWTH

## 2013-03-19 ENCOUNTER — Emergency Department (HOSPITAL_COMMUNITY)
Admission: EM | Admit: 2013-03-19 | Discharge: 2013-03-19 | Disposition: A | Discharge status: Home / Self Care | Payer: Medicaid Other | Attending: Emergency Medicine | Admitting: Emergency Medicine

## 2013-03-19 ENCOUNTER — Encounter (HOSPITAL_COMMUNITY): Payer: Self-pay | Admitting: Emergency Medicine

## 2013-03-19 ENCOUNTER — Emergency Department (HOSPITAL_COMMUNITY): Payer: Medicaid Other

## 2013-03-19 DIAGNOSIS — M25559 Pain in unspecified hip: Secondary | ICD-10-CM | POA: Insufficient documentation

## 2013-03-19 DIAGNOSIS — R52 Pain, unspecified: Secondary | ICD-10-CM | POA: Insufficient documentation

## 2013-03-19 DIAGNOSIS — F1410 Cocaine abuse, uncomplicated: Secondary | ICD-10-CM | POA: Insufficient documentation

## 2013-03-19 DIAGNOSIS — F411 Generalized anxiety disorder: Secondary | ICD-10-CM | POA: Insufficient documentation

## 2013-03-19 DIAGNOSIS — M25569 Pain in unspecified knee: Secondary | ICD-10-CM | POA: Insufficient documentation

## 2013-03-19 DIAGNOSIS — Z79899 Other long term (current) drug therapy: Secondary | ICD-10-CM | POA: Insufficient documentation

## 2013-03-19 DIAGNOSIS — F319 Bipolar disorder, unspecified: Secondary | ICD-10-CM | POA: Insufficient documentation

## 2013-03-19 DIAGNOSIS — M25562 Pain in left knee: Secondary | ICD-10-CM

## 2013-03-19 DIAGNOSIS — Z87828 Personal history of other (healed) physical injury and trauma: Secondary | ICD-10-CM | POA: Insufficient documentation

## 2013-03-19 MED ORDER — IBUPROFEN 800 MG PO TABS: 800.0000 mg | ORAL_TABLET | Freq: Three times a day (TID) | ORAL | Status: DC

## 2013-03-19 MED ORDER — OXYCODONE-ACETAMINOPHEN 5-325 MG PO TABS
1.0000 | ORAL_TABLET | ORAL | Status: DC | PRN
Start: 2013-03-19 — End: 2013-04-10

## 2013-03-19 MED ORDER — OXYCODONE-ACETAMINOPHEN 5-325 MG PO TABS
1.0000 | ORAL_TABLET | Freq: Once | ORAL | Status: AC
  Administered 2013-03-19: 1 via ORAL
  Filled 2013-03-19: qty 1

## 2013-03-19 MED ORDER — IBUPROFEN 800 MG PO TABS
800.0000 mg | ORAL_TABLET | Freq: Once | ORAL | Status: AC
  Administered 2013-03-19: 800 mg via ORAL
  Filled 2013-03-19: qty 1

## 2013-03-19 NOTE — ED Notes (Signed)
Pt c/ sudden pain to Left knee radiating into L hip. Denies known injury. Nad.
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
## 2013-03-19 NOTE — ED Notes (Signed)
Pain lt knee for 2 weeks, worse today, Increased pain with flexion.  No known injury.

## 2013-03-20 NOTE — ED Provider Notes (Signed)
Medical screening examination/treatment/procedure(s) were performed by non-physician practitioner and as supervising physician I was immediately available for consultation/collaboration.   Joya Gaskins, MD 03/20/13 [phone]

## 2013-04-10 ENCOUNTER — Encounter (HOSPITAL_COMMUNITY): Payer: Self-pay

## 2013-04-10 ENCOUNTER — Emergency Department (HOSPITAL_COMMUNITY)
Admission: EM | Admit: 2013-04-10 | Discharge: 2013-04-10 | Disposition: A | Discharge status: Home / Self Care | Payer: Medicaid Other | Attending: Emergency Medicine | Admitting: Emergency Medicine

## 2013-04-10 ENCOUNTER — Emergency Department (HOSPITAL_COMMUNITY): Payer: Medicaid Other

## 2013-04-10 DIAGNOSIS — Z79899 Other long term (current) drug therapy: Secondary | ICD-10-CM | POA: Insufficient documentation

## 2013-04-10 DIAGNOSIS — F411 Generalized anxiety disorder: Secondary | ICD-10-CM | POA: Insufficient documentation

## 2013-04-10 DIAGNOSIS — Y92410 Unspecified street and highway as the place of occurrence of the external cause: Secondary | ICD-10-CM | POA: Insufficient documentation

## 2013-04-10 DIAGNOSIS — F1410 Cocaine abuse, uncomplicated: Secondary | ICD-10-CM | POA: Insufficient documentation

## 2013-04-10 DIAGNOSIS — S20211A Contusion of right front wall of thorax, initial encounter: Secondary | ICD-10-CM

## 2013-04-10 DIAGNOSIS — S20219A Contusion of unspecified front wall of thorax, initial encounter: Secondary | ICD-10-CM | POA: Insufficient documentation

## 2013-04-10 DIAGNOSIS — S42143A Displaced fracture of glenoid cavity of scapula, unspecified shoulder, initial encounter for closed fracture: Secondary | ICD-10-CM | POA: Insufficient documentation

## 2013-04-10 DIAGNOSIS — Z87828 Personal history of other (healed) physical injury and trauma: Secondary | ICD-10-CM | POA: Insufficient documentation

## 2013-04-10 DIAGNOSIS — Y9389 Activity, other specified: Secondary | ICD-10-CM | POA: Insufficient documentation

## 2013-04-10 DIAGNOSIS — F319 Bipolar disorder, unspecified: Secondary | ICD-10-CM | POA: Insufficient documentation

## 2013-04-10 DIAGNOSIS — S42102A Fracture of unspecified part of scapula, left shoulder, initial encounter for closed fracture: Secondary | ICD-10-CM

## 2013-04-10 MED ORDER — OXYCODONE-ACETAMINOPHEN 5-325 MG PO TABS
ORAL_TABLET | ORAL | Status: AC
  Administered 2013-04-10: 1
  Filled 2013-04-10: qty 1

## 2013-04-10 MED ORDER — OXYCODONE-ACETAMINOPHEN 5-325 MG PO TABS: 1.0000 | ORAL_TABLET | ORAL | Status: DC | PRN
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
## 2013-04-14 NOTE — ED Provider Notes (Signed)
Medical screening examination/treatment/procedure(s) were performed by non-physician practitioner and as supervising physician I was immediately available for consultation/collaboration.   Tenecia Ignasiak W. Khary Schaben, MD 04/14/13 1552 

## 2013-05-18 ENCOUNTER — Encounter (HOSPITAL_COMMUNITY): Payer: Self-pay | Admitting: *Deleted

## 2013-05-18 ENCOUNTER — Inpatient Hospital Stay (HOSPITAL_COMMUNITY)
Admission: EM | Admit: 2013-05-18 | Discharge: 2013-05-19 | DRG: 897 | Disposition: A | Discharge status: Home / Self Care | Payer: MEDICAID | Source: Intra-hospital | Attending: Psychiatry | Admitting: Psychiatry

## 2013-05-18 ENCOUNTER — Emergency Department (HOSPITAL_COMMUNITY)
Admission: EM | Admit: 2013-05-18 | Discharge: 2013-05-18 | Disposition: A | Discharge status: Other Acute Inpatient Hospital | Payer: Medicaid Other | Attending: Emergency Medicine | Admitting: Emergency Medicine

## 2013-05-18 ENCOUNTER — Emergency Department (HOSPITAL_COMMUNITY): Payer: Medicaid Other

## 2013-05-18 ENCOUNTER — Encounter (HOSPITAL_COMMUNITY): Payer: Self-pay

## 2013-05-18 DIAGNOSIS — Z8659 Personal history of other mental and behavioral disorders: Secondary | ICD-10-CM | POA: Insufficient documentation

## 2013-05-18 DIAGNOSIS — R0789 Other chest pain: Secondary | ICD-10-CM | POA: Insufficient documentation

## 2013-05-18 DIAGNOSIS — F1010 Alcohol abuse, uncomplicated: Secondary | ICD-10-CM

## 2013-05-18 DIAGNOSIS — Z79899 Other long term (current) drug therapy: Secondary | ICD-10-CM | POA: Insufficient documentation

## 2013-05-18 DIAGNOSIS — F411 Generalized anxiety disorder: Secondary | ICD-10-CM | POA: Diagnosis present

## 2013-05-18 DIAGNOSIS — Z87828 Personal history of other (healed) physical injury and trauma: Secondary | ICD-10-CM | POA: Insufficient documentation

## 2013-05-18 DIAGNOSIS — R079 Chest pain, unspecified: Secondary | ICD-10-CM

## 2013-05-18 DIAGNOSIS — F1020 Alcohol dependence, uncomplicated: Principal | ICD-10-CM | POA: Diagnosis present

## 2013-05-18 DIAGNOSIS — F1320 Sedative, hypnotic or anxiolytic dependence, uncomplicated: Secondary | ICD-10-CM

## 2013-05-18 DIAGNOSIS — F1410 Cocaine abuse, uncomplicated: Secondary | ICD-10-CM | POA: Diagnosis present

## 2013-05-18 DIAGNOSIS — F319 Bipolar disorder, unspecified: Secondary | ICD-10-CM | POA: Diagnosis present

## 2013-05-18 DIAGNOSIS — F10220 Alcohol dependence with intoxication, uncomplicated: Secondary | ICD-10-CM

## 2013-05-18 LAB — COMPREHENSIVE METABOLIC PANEL
Alkaline Phosphatase: 125 U/L — ABNORMAL HIGH (ref 39–117)
BUN: 12 mg/dL (ref 6–23)
Creatinine, Ser: 1.29 mg/dL (ref 0.50–1.35)
GFR calc Af Amer: 79 mL/min — ABNORMAL LOW (ref >90)
Glucose, Bld: 127 mg/dL — ABNORMAL HIGH (ref 70–99)
Potassium: 4 mEq/L (ref 3.5–5.1)
Total Bilirubin: 0.4 mg/dL (ref 0.3–1.2)
Total Protein: 7.7 g/dL (ref 6.0–8.3)

## 2013-05-18 LAB — POCT I-STAT TROPONIN I
Troponin i, poc: 0 ng/mL (ref 0.00–0.08)
Troponin i, poc: 0 ng/mL (ref 0.00–0.08)

## 2013-05-18 LAB — CBC
HCT: 46 % (ref 39.0–52.0)
MCH: 32.7 pg (ref 26.0–34.0)
MCV: 93.9 fL (ref 78.0–100.0)
RDW: 13.7 % (ref 11.5–15.5)
WBC: 8.7 10*3/uL (ref 4.0–10.5)

## 2013-05-18 LAB — RAPID URINE DRUG SCREEN, HOSP PERFORMED
Amphetamines: NOT DETECTED
Benzodiazepines: POSITIVE — AB
Opiates: NOT DETECTED
Tetrahydrocannabinol: NOT DETECTED

## 2013-05-18 LAB — URINALYSIS, ROUTINE W REFLEX MICROSCOPIC
Glucose, UA: NEGATIVE mg/dL
Hgb urine dipstick: NEGATIVE
Ketones, ur: NEGATIVE mg/dL
Leukocytes, UA: NEGATIVE
pH: 6.5 (ref 5.0–8.0)

## 2013-05-18 MED ORDER — TRAZODONE HCL 50 MG PO TABS
50.0000 mg | ORAL_TABLET | Freq: Every evening | ORAL | Status: DC | PRN
  Filled 2013-05-18 (×2): qty 1

## 2013-05-18 MED ORDER — CHLORDIAZEPOXIDE HCL 25 MG PO CAPS: 25.0000 mg | ORAL_CAPSULE | Freq: Three times a day (TID) | ORAL | Status: DC

## 2013-05-18 MED ORDER — MAGNESIUM HYDROXIDE 400 MG/5ML PO SUSP: 30.0000 mL | Freq: Every day | ORAL | Status: DC | PRN

## 2013-05-18 MED ORDER — GABAPENTIN 300 MG PO CAPS
300.0000 mg | ORAL_CAPSULE | Freq: Every day | ORAL | Status: DC
  Administered 2013-05-18: 300 mg via ORAL
  Filled 2013-05-18 (×3): qty 1

## 2013-05-18 MED ORDER — THIAMINE HCL 100 MG/ML IJ SOLN: 100.0000 mg | Freq: Every day | INTRAMUSCULAR | Status: DC

## 2013-05-18 MED ORDER — ZOLPIDEM TARTRATE 5 MG PO TABS: 5.0000 mg | ORAL_TABLET | Freq: Every evening | ORAL | Status: DC | PRN

## 2013-05-18 MED ORDER — ACETAMINOPHEN 325 MG PO TABS: 650.0000 mg | ORAL_TABLET | Freq: Four times a day (QID) | ORAL | Status: DC | PRN

## 2013-05-18 MED ORDER — VITAMIN B-1 100 MG PO TABS
100.0000 mg | ORAL_TABLET | Freq: Every day | ORAL | Status: DC
  Administered 2013-05-18: 100 mg via ORAL
  Filled 2013-05-18: qty 1

## 2013-05-18 MED ORDER — LORAZEPAM 2 MG/ML IJ SOLN
0.0000 mg | Freq: Four times a day (QID) | INTRAMUSCULAR | Status: DC
  Administered 2013-05-18 (×2): 2 mg via INTRAVENOUS
  Filled 2013-05-18 (×2): qty 1

## 2013-05-18 MED ORDER — SODIUM CHLORIDE 0.9 % IV BOLUS (SEPSIS)
1000.0000 mL | Freq: Once | INTRAVENOUS | Status: AC
  Administered 2013-05-18: 1000 mL via INTRAVENOUS

## 2013-05-18 MED ORDER — IBUPROFEN 200 MG PO TABS: 600.0000 mg | ORAL_TABLET | Freq: Three times a day (TID) | ORAL | Status: DC | PRN

## 2013-05-18 MED ORDER — CHLORDIAZEPOXIDE HCL 25 MG PO CAPS
25.0000 mg | ORAL_CAPSULE | Freq: Four times a day (QID) | ORAL | Status: DC
  Administered 2013-05-18: 25 mg via ORAL
  Filled 2013-05-18: qty 1

## 2013-05-18 MED ORDER — LORAZEPAM 2 MG/ML IJ SOLN: 0.0000 mg | Freq: Two times a day (BID) | INTRAMUSCULAR | Status: DC

## 2013-05-18 MED ORDER — CHLORDIAZEPOXIDE HCL 25 MG PO CAPS: 25.0000 mg | ORAL_CAPSULE | Freq: Every day | ORAL | Status: DC

## 2013-05-18 MED ORDER — CHLORDIAZEPOXIDE HCL 25 MG PO CAPS: 25.0000 mg | ORAL_CAPSULE | ORAL | Status: DC

## 2013-05-18 MED ORDER — NITROGLYCERIN 0.4 MG SL SUBL
0.4000 mg | SUBLINGUAL_TABLET | SUBLINGUAL | Status: DC | PRN
  Administered 2013-05-18: 0.4 mg via SUBLINGUAL

## 2013-05-18 MED ORDER — ACETAMINOPHEN 325 MG PO TABS: 650.0000 mg | ORAL_TABLET | ORAL | Status: DC | PRN

## 2013-05-18 MED ORDER — ALUM & MAG HYDROXIDE-SIMETH 200-200-20 MG/5ML PO SUSP: 30.0000 mL | ORAL | Status: DC | PRN

## 2013-05-18 MED ORDER — VITAMIN B-1 100 MG PO TABS
100.0000 mg | ORAL_TABLET | Freq: Every day | ORAL | Status: DC
  Filled 2013-05-18 (×2): qty 1

## 2013-05-18 MED ORDER — ONDANSETRON 4 MG PO TBDP: 4.0000 mg | ORAL_TABLET | Freq: Four times a day (QID) | ORAL | Status: DC | PRN

## 2013-05-18 MED ORDER — ALPRAZOLAM 1 MG PO TABS
1.0000 mg | ORAL_TABLET | Freq: Four times a day (QID) | ORAL | Status: DC
  Administered 2013-05-18: 1 mg via ORAL
  Filled 2013-05-18: qty 1

## 2013-05-18 MED ORDER — ONDANSETRON HCL 4 MG PO TABS: 4.0000 mg | ORAL_TABLET | Freq: Three times a day (TID) | ORAL | Status: DC | PRN

## 2013-05-18 MED ORDER — CLONIDINE HCL 0.1 MG/24HR TD PTWK
0.1000 mg | MEDICATED_PATCH | TRANSDERMAL | Status: DC
  Administered 2013-05-18: 0.1 mg via TRANSDERMAL
  Filled 2013-05-18 (×2): qty 1

## 2013-05-18 MED ORDER — ADULT MULTIVITAMIN W/MINERALS CH
1.0000 | ORAL_TABLET | Freq: Every day | ORAL | Status: DC
  Filled 2013-05-18 (×2): qty 1

## 2013-05-18 MED ORDER — THIAMINE HCL 100 MG/ML IJ SOLN: 100.0000 mg | Freq: Once | INTRAMUSCULAR | Status: DC

## 2013-05-18 MED ORDER — METHOCARBAMOL 500 MG PO TABS
500.0000 mg | ORAL_TABLET | Freq: Three times a day (TID) | ORAL | Status: DC | PRN
  Administered 2013-05-18: 500 mg via ORAL
  Filled 2013-05-18: qty 1

## 2013-05-18 MED ORDER — NICOTINE 21 MG/24HR TD PT24: 21.0000 mg | MEDICATED_PATCH | Freq: Every day | TRANSDERMAL | Status: DC

## 2013-05-18 MED ORDER — HYDROXYZINE HCL 25 MG PO TABS: 25.0000 mg | ORAL_TABLET | Freq: Four times a day (QID) | ORAL | Status: DC | PRN

## 2013-05-18 MED ORDER — LOPERAMIDE HCL 2 MG PO CAPS: 2.0000 mg | ORAL_CAPSULE | ORAL | Status: DC | PRN

## 2013-05-18 MED ORDER — CHLORDIAZEPOXIDE HCL 25 MG PO CAPS
25.0000 mg | ORAL_CAPSULE | Freq: Four times a day (QID) | ORAL | Status: DC | PRN
  Filled 2013-05-18: qty 1

## 2013-05-18 MED ORDER — ASPIRIN 81 MG PO CHEW
324.0000 mg | CHEWABLE_TABLET | Freq: Once | ORAL | Status: AC
  Administered 2013-05-18: 324 mg via ORAL
  Filled 2013-05-18: qty 4
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
## 2013-05-18 NOTE — ED Notes (Signed)
Pt continues to take [place] out of nose, replaced multiple times with rationale given.

## 2013-05-18 NOTE — ED Notes (Signed)
EDP in to see pt, speaking with pt at [facility].

## 2013-05-18 NOTE — ED Notes (Signed)
The pt has been on a 5 day drunk and he has been doing cocaine.  Tonight he began to have chest pain with sob.

## 2013-05-18 NOTE — BH Assessment (Addendum)
Assessment Note   Gary Stanley is an 42 y.o. male that was assessed this day after being medically cleared for initially reporting chest pain.  Pt stated he has been on a 5 day alcohol and cocaine binge. Pt reported that he has been drinking a case of beer and one fifth of liquor daily, last drink was this morning before arriving to [facility].  Pt stated he had 10 mixed drinks and 8 beers at the bar last night.  Pt stated he has smoked $100 cocaine in the past two days.  Pt stated he had been sober for one month, but began to feel lonely and depressed because he has been alone in his parents' house because his mother got placed in a nursing home and his dad has been gone visiting her.  Pt stated he has also been thinking about his nephew that overdosed 3 years ago and still has some grief issues.  Pt stated he has had insomnia, weight gain, crying spells, and sadness.  Pt has had alcohol detox and treatment in the past.  Pt sees a psychiatrist for med mgnt currently.  Pt stated he takes Xanax and Valium for panic attacks and hx of head trauma.  Pt stated his psychiatrist recently added Neurontin on 7/24 but that he only took it for 2 days because he started drinking and using cocaine.  Pt denies SI/HI or psychosis.  Pt calm, cooperative and motivated for treatment.  Referral faxed to [facility] for review.  Completed assessment and updated ED staff.  Axis I: 305.00 Alcohol Abuse, 305.60 Cocaine Abuse, 296.32 Major Depressive Disorder, Recurrent, Moderate Axis II: Deferred Axis III:  Past Medical History  Diagnosis Date  . Bipolar affective disorder   . Anxiety   . 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 Alcohol abuse   . Cocaine abuse    Axis IV: occupational problems, other psychosocial or environmental problems and problems related to social environment Axis V: 31-40 impairment in reality testing  Past Medical History:  Past Medical History  Diagnosis Date  . Bipolar affective disorder   . Anxiety   . Trauma to brain   . Alcohol abuse   . Cocaine abuse     Past Surgical History  Procedure Laterality Date  . Craniectomy      pt had surgery to remove blood clot from brain     Family History: No family history on file.  Social History:  reports that he has never smoked. He does not have any smokeless tobacco history on file. He reports that he drinks about 14.4 ounces of alcohol per week. He reports that he uses illicit drugs ("Crack" cocaine and Cocaine) about 3 times per week.  Additional Social History:  Alcohol / Drug Use Pain Medications: see MAR Prescriptions: see MAR Over the Counter: see MAR History of alcohol / drug use?: Yes Longest period of sobriety (when/how long): unknown Negative Consequences of Use: Personal relationships Withdrawal Symptoms: Patient aware of relationship between substance abuse and physical/medical complications;Tremors;Sweats Substance #1 Name of Substance 1: ETOH 1 - Age of First Use: 16 1 - Amount (size/oz): 24 beers and one fifth of liquor 1 - Frequency: daily 1 - Duration: 5 days 1 - Last Use / Amount: This morning - 8 beers, 1/5 liquor, 10 mixed drinks Substance #2 Name of Substance 2: Cocaine 2 - Age of First Use: mid 20's 2 - Amount (size/oz): $100 2 - Frequency: the past two days  2 - Duration:  has used infrequently  in the past 2 - Last Use / Amount: 05/17/13 - $100 over 2 days  CIWA: CIWA-Ar BP: 147/84 mmHg Pulse Rate: 82 Nausea and Vomiting: no nausea and no vomiting Tactile Disturbances: none Tremor: moderate, with patient's arms extended Auditory Disturbances: not present Paroxysmal Sweats: beads of sweat obvious on forehead Visual Disturbances: not present Anxiety: six Headache, Fullness in Head: none present Agitation: six Orientation and Clouding of Sensorium: oriented and can do serial additions CIWA-Ar Total: 20 COWS:    Allergies:  Allergies  Allergen Reactions  . Hydrocodone Itching and Nausea Only    Home Medications:   (Not in a hospital admission)  OB/GYN Status:  No LMP for male patient.  General Assessment Data Location of Assessment: [facility] ED Living Arrangements: Parent Can pt return to current living arrangement?: Yes Admission Status: Voluntary Is patient capable of signing voluntary admission?: Yes Transfer from: Acute Hospital Referral Source: Self/Family/Friend  Education Status Is patient currently in school?: No  Risk to self Suicidal Ideation: No Suicidal Intent: No Is patient at risk for suicide?: No Suicidal Plan?: No Access to Means: No What has been your use of drugs/alcohol within the last 12 months?: pt has been on alcohol and cocaine binge for 5 days Previous Attempts/Gestures: No How many times?: 0 Other Self Harm Risks: pt denies Triggers for Past Attempts: None known Intentional Self Injurious Behavior: None Family Suicide History: No Recent stressful life event(s): Recent negative physical changes;Other (Comment) (mother in nursing home, continued grief) Persecutory voices/beliefs?: No Depression: Yes Depression Symptoms: Despondent;Insomnia;Tearfulness;Isolating;Fatigue;Loss of interest in usual pleasures;Feeling worthless/self pity Substance abuse history and/or treatment for substance abuse?: Yes Suicide prevention information given to non-admitted patients: Not applicable  Risk to Others Homicidal Ideation: No Thoughts of Harm to Others: No Current Homicidal Intent: No Current Homicidal Plan: No Access to Homicidal Means: No Identified Victim: pt denies History of harm to others?: No Assessment of Violence: None Noted Violent Behavior Description: na - pt calm, cooperative Does patient have access to weapons?: No Criminal Charges Pending?: No Does patient have a court date: No  Psychosis Hallucinations: None noted Delusions: None noted  Mental Status Report Appear/Hygiene: Disheveled Eye Contact: Fair Motor Activity: Tremors Speech:  Logical/coherent Level of Consciousness: Alert Mood: Depressed;Sad Affect: Depressed;Sad Anxiety Level: Panic Attacks Panic attack frequency: varies Most recent panic attack: one week ago Thought Processes: Coherent;Relevant Judgement: Impaired Orientation: Person;Place;Time;Situation Obsessive Compulsive Thoughts/Behaviors: None  Cognitive Functioning Concentration: Decreased Memory: Recent Impaired;Remote Impaired IQ: Average Insight: Poor Impulse Control: Poor Appetite: Poor Weight Loss: 0 Weight Gain: 10 (over last 2 months) Sleep: No Change Total Hours of Sleep:  (3-4 hrs per night) Vegetative Symptoms: None  ADLScreening [facility] Assessment Services) Patient's cognitive ability adequate to safely complete daily activities?: Yes Patient able to express need for assistance with ADLs?: No Independently performs ADLs?: Yes (appropriate for developmental age)  Abuse/Neglect [facility]) Physical Abuse: Denies Verbal Abuse: Denies Sexual Abuse: Denies  Prior Inpatient Therapy Prior Inpatient Therapy: Yes Prior Therapy Dates: 2010, 1012, 2013 Prior Therapy Facilty/Provider(s): [facility], [facility], [facility], [facility] Reason for Treatment: detox/treatment for SA  Prior Outpatient Therapy Prior Outpatient Therapy: Yes Prior Therapy Dates: Current Prior Therapy Facilty/Provider(s): [facility] [place], [place] Reason for Treatment: med mgnt  ADL Screening (condition at time of admission) Patient's cognitive ability adequate to safely complete daily activities?: Yes Is the patient deaf or have difficulty hearing?: No Does the patient have difficulty seeing, even when wearing glasses/contacts?: No Does the patient have difficulty concentrating, remembering,  or making decisions?: Yes Patient able to express need for assistance with ADLs?: No Does the patient have difficulty dressing or bathing?: No Independently performs ADLs?: Yes (appropriate for developmental age) Does the patient  have difficulty walking or climbing stairs?: No  Home Assistive Devices/Equipment Home Assistive Devices/Equipment: None    Abuse/Neglect Assessment (Assessment to be complete while patient is alone) Physical Abuse: Denies Verbal Abuse: Denies Sexual Abuse: Denies Exploitation of patient/patient's resources: Denies Self-Neglect: Denies Values / Beliefs Cultural Requests During Hospitalization: None Spiritual Requests During Hospitalization: None Consults Spiritual Care Consult Needed: No Social Work Consult Needed: No Merchant navy officer (For Healthcare) Advance Directive: Patient does not have advance directive;Patient would not like information    Additional Information 1:1 In Past 12 Months?: No CIRT Risk: No Elopement Risk: No Does patient have medical clearance?: Yes     Disposition:  Pending [facility]  On Site Evaluation by:   Reviewed with Physician:  [place] Callas, Rennis Harding 05/18/2013 12:20 PM

## 2013-05-18 NOTE — BH Assessment (Signed)
Assessment Note  Update:  Received call from Thurman Coyer, [facility], stating pt accepted by Dr. Lucianne Muss to Dr. Dub Mikes to bed and that pt could be transported to [facility].  Updated EDP Pollina and ED staff.  Completed support paperwork and faxed to [facility] to log.  Updated assessment disposition.  ED staff to arrange transport to [facility] via security, as pt is voluntary.      Disposition:  Disposition Initial Assessment Completed for this Encounter: Yes Disposition of Patient: Inpatient treatment program Type of inpatient treatment program: Adult Patient referred to: Other (Comment) (Pt accepted [facility])  On Site Evaluation by:   Reviewed with Physician:  Darol Destine 05/18/2013 6:06 PM

## 2013-05-18 NOTE — ED Notes (Signed)
Spoke with ACT team who stated spoke with behavioral health and currently waiting for a discharge.

## 2013-05-18 NOTE — ED Notes (Addendum)
Natalbany returned to nares, pt had removed, rationale explained to pt, ice chips given per request, pt sleeping & mouth breathing, with intermitent low SPO2 readings. 95% 2L [place].

## 2013-05-18 NOTE — ED Notes (Signed)
Pt sleeping, sonorous resps, NAD, calm.

## 2013-05-18 NOTE — ED Notes (Signed)
2056  Report called to [facility].

## 2013-05-19 ENCOUNTER — Encounter (HOSPITAL_COMMUNITY): Payer: Self-pay | Admitting: Psychiatry

## 2013-05-19 ENCOUNTER — Other Ambulatory Visit (HOSPITAL_COMMUNITY): Payer: Self-pay | Admitting: Orthopedic Surgery

## 2013-05-19 DIAGNOSIS — F1410 Cocaine abuse, uncomplicated: Secondary | ICD-10-CM | POA: Diagnosis present

## 2013-05-19 DIAGNOSIS — M545 Low back pain: Secondary | ICD-10-CM

## 2013-05-19 DIAGNOSIS — F1920 Other psychoactive substance dependence, uncomplicated: Secondary | ICD-10-CM

## 2013-05-19 DIAGNOSIS — F1010 Alcohol abuse, uncomplicated: Secondary | ICD-10-CM | POA: Diagnosis present

## 2013-05-19 DIAGNOSIS — F1320 Sedative, hypnotic or anxiolytic dependence, uncomplicated: Secondary | ICD-10-CM | POA: Diagnosis present

## 2013-05-19 MED ORDER — GABAPENTIN 100 MG PO CAPS: 300.0000 mg | ORAL_CAPSULE | Freq: Every day | ORAL | Status: DC

## 2013-05-19 MED ORDER — CLONIDINE HCL 0.1 MG/24HR TD PTWK: 1.0000 | MEDICATED_PATCH | TRANSDERMAL | Status: DC

## 2013-05-19 MED ORDER — ALPRAZOLAM 1 MG PO TABS: 1.0000 mg | ORAL_TABLET | Freq: Four times a day (QID) | ORAL | Status: DC

## 2013-05-19 MED ORDER — DIAZEPAM 10 MG PO TABS: 10.0000 mg | ORAL_TABLET | Freq: Four times a day (QID) | ORAL | Status: DC

## 2013-05-19 NOTE — Progress Notes (Signed)
[facility] Adult Case Management Discharge Plan :  Will you be returning to the same living situation after discharge: Yes,  home with father At discharge, do you have transportation home?:Yes,  pt's father Do you have the ability to pay for your medications:Yes,  medicaid  Release of information consent forms completed and in the chart;  Patient's signature needed at discharge.  Patient to Follow up at: Pt refused to sign consent/releases and refused assistance with followup. Pt stated that he does have appts in place.     Patient denies SI/HI:   Yes,  during admission/self report    Safety Planning and Suicide Prevention discussed:  Yes,  SPE not required for this pt as he did not endorse SI during admission or during stay at [facility].   Smart, Brendia Dampier 05/19/2013, 10:42 AM

## 2013-05-19 NOTE — Progress Notes (Signed)
The focus of this group is to educate the patient on the purpose and policies of crisis stabilization and provide a format to answer questions about their admission.  The group details unit policies and expectations of patients while admitted. ° °Pt did not attend. °

## 2013-05-19 NOTE — Discharge Summary (Signed)
Physician Discharge Summary Note  Patient:  Gary Stanley is an 42 y.o., male MRN:  [id number] DOB:  [birth year] Patient phone:  [phone] (home)  Patient address:   [address] [place] [address],   Date of Admission:  05/18/2013 Date of Discharge: 05/19/13  Reason for Admission:  Drug detox  Discharge Diagnoses: Active Problems:   Alcohol abuse   Cocaine abuse   Benzodiazepine dependence  Review of Systems  Constitutional: Negative.        Obese  HENT: Negative.   Eyes: Negative.   Respiratory: Negative.   Cardiovascular: Negative.   Gastrointestinal: Negative.   Genitourinary: Negative.   Musculoskeletal: Negative.   Skin: Negative.   Neurological: Negative.   Endo/Heme/Allergies: Negative.   Psychiatric/Behavioral: Positive for substance abuse (Cocaine, alcohol, Benzodiazepine dependence). Negative for depression, suicidal ideas, hallucinations and memory loss. The patient is nervous/anxious and has insomnia.    Axis Diagnosis:   AXIS I:  Polysubstance (excluding opioids) dependence, daily use, Benzodiazepine dependence AXIS II:  Deferred AXIS III:   Past Medical History  Diagnosis Date  . Anxiety   . 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 Alcohol abuse   . Cocaine abuse   . Bipolar affective disorder    AXIS IV:  Polysubstance abuse/dependence AXIS V:  57  Level of Care:  OP  Hospital Course:  42 Y/o male with extensive history of substance abuse who states was in a three day alcohol/cocaine binge triggered by memories of his dead nephew. He states he is being prescribed Xanax 1 mg QID. He claims that during the binge he did not take the Xanax, except the last day when he took the 4 mg (prescribe) . He came for "alcohol detox." He states he is also prescribed Valium for spasm. He will not agree to have the Librium detox protocol and not take the Xanax while he is being detox.   Mr. Rohr stay in this hospital was rather very brief. His stay in this hospital was  less than 24 hours. Mr. Mota has chosen going home instead of staying the course of his alcohol detoxification treatment. He came in with UDS/toxicology reports positive for Alcohol at 202, (+)cocaine and Benzodiazepine. He states that he has been on a 3-day alcohol and cocaine binge. It was apparent that he was intoxicated needing detoxification treatment protocol to stabilize his systems of alcohol intoxication and to combat the withdrawal symptoms as well. He was then started on Librium protocol for her alcohol intoxication detoxification. He was also enrolled in group counseling sessions/activities to learn coping skills that should help him after discharge to cope better and manage his substance abuse issues for a much sustained sobriety. Mr. Perleberg also presents other medical issues and or concerns that required monitoring and or treatments such as high blood pressure. However, he has at this time chosen to be discharged to follow-up with his primary psychiatrist whom he has a follow-up appointment with within the next few days. He declines to take his librium capsules ordered.  Mr. Catino is currently being discharged to his home. Brannan says that he is feeling better emotionally and physically, and stable to be discharged per his request. He does not want to get off his benzodiazepines or gets weaned off of them. He denies any withdrawal symptoms of alcohol and or any other substances. He adamantly denies any suicidal homicidal ideations, auditory, visual hallucinations, delusional thoughts and or or feelings of paranoia. He will follow-up care with his primary psychiatrist. He  also has an MRI appointment at the [facility] on 05/21/13 at 09:00 am. The address, date, time and contact information for this hospital provided for patient in writing. He left [facility] with all personal belongings via personal arranged transport in no apparent distress.  Consults:  psychiatry  Significant Diagnostic  Studies:  labs: CBC with diff, CMP, UDS, Toxicology tests, U/A  Discharge Vitals:   Blood pressure 179/111, pulse 86, temperature 97 F (36.1 C), temperature source Oral, resp. rate 18, height 6' 0.44" (1.84 m), weight 128.368 kg (283 lb). Body mass index is 37.92 kg/(m^2). Lab Results:   Results for orders placed during the hospital encounter of 05/18/13 (from the past 72 hour(s))  CBC     Status: None   Collection Time    05/18/13  3:23 AM      Result Value Range   WBC 8.7  4.0 - 10.5 K/uL   RBC 4.90  4.22 - 5.81 MIL/uL   Hemoglobin 16.0  13.0 - 17.0 g/dL   HCT 16.1  09.6 - 04.5 %   MCV 93.9  78.0 - 100.0 fL   MCH 32.7  26.0 - 34.0 pg   MCHC 34.8  30.0 - 36.0 g/dL   RDW 40.9  81.1 - 91.4 %   Platelets 229  150 - 400 K/uL  ETHANOL     Status: Abnormal   Collection Time    05/18/13  3:23 AM      Result Value Range   Alcohol, Ethyl (B) 202 (*) 0 - 11 mg/dL   Comment:            LOWEST DETECTABLE LIMIT FOR     SERUM ALCOHOL IS 11 mg/dL     FOR MEDICAL PURPOSES ONLY  COMPREHENSIVE METABOLIC PANEL     Status: Abnormal   Collection Time    05/18/13  3:23 AM      Result Value Range   Sodium 138  135 - 145 mEq/L   Potassium 4.0  3.5 - 5.1 mEq/L   Chloride 100  96 - 112 mEq/L   CO2 24  19 - 32 mEq/L   Glucose, Bld 127 (*) 70 - 99 mg/dL   BUN 12  6 - 23 mg/dL   Creatinine, Ser 7.82  0.50 - 1.35 mg/dL   Calcium 9.1  8.4 - 95.6 mg/dL   Total Protein 7.7  6.0 - 8.3 g/dL   Albumin 3.9  3.5 - 5.2 g/dL   AST 44 (*) 0 - 37 U/L   ALT 61 (*) 0 - 53 U/L   Alkaline Phosphatase 125 (*) 39 - 117 U/L   Total Bilirubin 0.4  0.3 - 1.2 mg/dL   GFR calc non Af Amer 68 (*) >90 mL/min   GFR calc Af Amer 79 (*) >90 mL/min   Comment:            The eGFR has been calculated     using the CKD EPI equation.     This calculation has not been     validated in all clinical     situations.     eGFR's persistently     <90 mL/min signify     possible Chronic Kidney Disease.  POCT I-STAT TROPONIN I      Status: None   Collection Time    05/18/13  3:27 AM      Result Value Range   Troponin i, poc 0.00  0.00 - 0.08 ng/mL   Comment 3  Comment: Due to the release kinetics of cTnI,     a negative result within the first hours     of the onset of symptoms does not rule out     myocardial infarction with certainty.     If myocardial infarction is still suspected,     repeat the test at appropriate intervals.  POCT I-STAT TROPONIN I     Status: None   Collection Time    05/18/13  6:38 AM      Result Value Range   Troponin i, poc 0.00  0.00 - 0.08 ng/mL   Comment 3            Comment: Due to the release kinetics of cTnI,     a negative result within the first hours     of the onset of symptoms does not rule out     myocardial infarction with certainty.     If myocardial infarction is still suspected,     repeat the test at appropriate intervals.  POCT I-STAT TROPONIN I     Status: None   Collection Time    05/18/13  9:32 AM      Result Value Range   Troponin i, poc 0.00  0.00 - 0.08 ng/mL   Comment 3            Comment: Due to the release kinetics of cTnI,     a negative result within the first hours     of the onset of symptoms does not rule out     myocardial infarction with certainty.     If myocardial infarction is still suspected,     repeat the test at appropriate intervals.  URINALYSIS, ROUTINE W REFLEX MICROSCOPIC     Status: None   Collection Time    05/18/13 11:07 AM      Result Value Range   Color, Urine YELLOW  YELLOW   APPearance CLEAR  CLEAR   Specific Gravity, Urine 1.013  1.005 - 1.030   pH 6.5  5.0 - 8.0   Glucose, UA NEGATIVE  NEGATIVE mg/dL   Hgb urine dipstick NEGATIVE  NEGATIVE   Bilirubin Urine NEGATIVE  NEGATIVE   Ketones, ur NEGATIVE  NEGATIVE mg/dL   Protein, ur NEGATIVE  NEGATIVE mg/dL   Urobilinogen, UA 0.2  0.0 - 1.0 mg/dL   Nitrite NEGATIVE  NEGATIVE   Leukocytes, UA NEGATIVE  NEGATIVE   Comment: MICROSCOPIC NOT DONE ON URINES  WITH NEGATIVE PROTEIN, BLOOD, LEUKOCYTES, NITRITE, OR GLUCOSE <1000 mg/dL.  URINE RAPID DRUG SCREEN (HOSP PERFORMED)     Status: Abnormal   Collection Time    05/18/13 11:07 AM      Result Value Range   Opiates NONE DETECTED  NONE DETECTED   Cocaine POSITIVE (*) NONE DETECTED   Benzodiazepines POSITIVE (*) NONE DETECTED   Amphetamines NONE DETECTED  NONE DETECTED   Tetrahydrocannabinol NONE DETECTED  NONE DETECTED   Barbiturates NONE DETECTED  NONE DETECTED   Comment:            DRUG SCREEN FOR MEDICAL PURPOSES     ONLY.  IF CONFIRMATION IS NEEDED     FOR ANY PURPOSE, NOTIFY LAB     WITHIN 5 DAYS.                LOWEST DETECTABLE LIMITS     FOR URINE DRUG SCREEN     Drug Class       Cutoff (ng/mL)  Amphetamine      1000     Barbiturate      200     Benzodiazepine   200     Tricyclics       300     Opiates          300     Cocaine          300     THC              50    Physical Findings: AIMS:  , ,  ,  , Dental Status Current problems with teeth and/or dentures?: No Does patient usually wear dentures?: No  CIWA:  CIWA-Ar Total: 2 COWS:     Psychiatric Specialty Exam: See Psychiatric Specialty Exam and Suicide Risk Assessment completed by Attending Physician prior to discharge.  Discharge destination:  Home  Is patient on multiple antipsychotic therapies at discharge:  No   Has Patient had three or more failed trials of antipsychotic monotherapy by history:  No  Recommended Plan for Multiple Antipsychotic Therapies: NA       Future Appointments Provider Department Dept Phone   05/21/2013 9:00 AM Ap-Mr [facility] [place] [facility] [phone]   Patient to arrive 15 minutes prior to appointment time.       Medication List       Indication   ALPRAZolam 1 MG tablet  Commonly known as:  XANAX  Take 1 tablet (1 mg total) by mouth 4 (four) times daily. For anxiety   Indication:  Agitation, Feeling Anxious     cloNIDine 0.1 mg/24hr patch  Commonly known as:  CATAPRES -  Dosed in mg/24 hr  Place 1 patch (0.1 mg total) onto the skin once a week. For high blood pressure   Indication:  High Blood Pressure     diazepam 10 MG tablet  Commonly known as:  VALIUM  Take 1 tablet (10 mg total) by mouth 4 (four) times daily. For anxiety   Indication:  Feeling Anxious     gabapentin 100 MG capsule  Commonly known as:  NEURONTIN  Take 3 capsules (300 mg total) by mouth at bedtime. For anxiety/pain management   Indication:  Agitation, Pain, Anxiety       Follow-up recommendations: Activity:  As tolerated Diet: As recommended by your primary care doctor. Keep all scheduled follow-up appointments as recommended.   Continue to work your relapse prevention plan Comments:  Take all your medications as prescribed by your mental healthcare provider. Report any adverse effects and or reactions from your medicines to your outpatient provider promptly. Patient is instructed and cautioned to not engage in alcohol and or illegal drug use while on prescription medicines. In the event of worsening symptoms, patient is instructed to call the crisis hotline, 911 and or go to the nearest ED for appropriate evaluation and treatment of symptoms. Follow-up with your primary care provider for your other medical issues, concerns and or health care needs.   Total Discharge Time:  Greater than 30 minutes.  Signed: Sanjuana Kava, PMHNP-BC 05/19/2013, 1:18 PM Agree with assessment and plan Madie [place] A. Dub Mikes, M.D.

## 2013-05-19 NOTE — [facility] Suicide Risk Assessment (Signed)
[facility] INPATIENT: Family/Significant Other Suicide Prevention Education  Suicide Prevention Education:  Education Completed; No one has been identified by the patient as the family member/significant other with whom the patient will be residing, and identified as the person(s) who will aid the patient in the event of a mental health crisis (suicidal ideations/suicide attempt).   Pt did not c/o SI at admission, nor have they endorsed SI during their stay here. SPE not required.  [employer], LCSWA 05/19/2013 10:39 AM
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
## 2013-05-19 NOTE — Tx Team (Addendum)
Initial Interdisciplinary Treatment Plan  PATIENT STRENGTHS: (choose at least two) Ability for insight Active sense of humor Average or above average intelligence Financial means General fund of knowledge Motivation for treatment/growth Religious Affiliation Supportive family/friends  PATIENT STRESSORS: Health problems Loss of nephew 3 yrs ago  Substance abuse Mom in rehab and Dad is constantly visitng. Pt feels alone and depressed over his mom's health. Pt resides with parents.    PROBLEM LIST: Problem List/Patient Goals Date to be addressed Date deferred Reason deferred Estimated date of resolution  Etoh Detox/ Abuse with cocaine use 7/28   D/C  Depression 7/28     Anxiety 7/28                                          DISCHARGE CRITERIA:  Ability to meet basic life and health needs Improved stabilization in mood, thinking, and/or behavior Medical problems require only outpatient monitoring Need for constant or close observation no longer present Verbal commitment to aftercare and medication compliance Withdrawal symptoms are absent or subacute and managed without 24-hour nursing intervention  PRELIMINARY DISCHARGE PLAN: Attend 12-step recovery group Return to previous living arrangement  PATIENT/FAMIILY INVOLVEMENT: This treatment plan has been presented to and reviewed with the patient, Gary Stanley.  The patient and family have been given the opportunity to ask questions and make suggestions.  Edwyna Dangerfield A 05/19/2013, 7:03 AM

## 2013-05-19 NOTE — Progress Notes (Signed)
Pt was discharged to home today.  He was angry that he was not allowed to have his xanax and valium as he was taking at home.  He denied any S/H ideation or A/V/H.  He was given his discharge instructions and he threw them away in the trash can in the search room as he walked out.  Informed him that had his private health information and he stated,"I don't care" and kept walking out to the lobby.  He was given the home safety sheet which he did keep.
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
## 2013-05-19 NOTE — Clinical Social Work Note (Signed)
CSW met with pt individually. Pt is discharging today due to medication non compliance and refusal to participate in programming/groups. Pt is angry that he cannot take xanax and valium while at [facility]. He refused to sign releases and stated that he is set up with services already. Pt refusing to sign all paperwork. He did not endorse SI during admission or during stay at [facility]. No PSA required being that pt had been at hospital less than 72 hours.

## 2013-05-19 NOTE — Progress Notes (Signed)
Pt was angry when he found out he would not be given the xanax while he was here.  He refused his am medications he stated,"If I can't take all my medication at once I don't want anything" He went on to say that he was prescribed xanax for his anxiety and valium for his leg cramps.  Pt refused again around 0945 Dr. Dub Mikes aware.  He rated his depression a 8 hopelessness a 5 and his anxiety a 10 on his self-inventory.  Plan to discharge pt later today.
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
## 2013-05-21 ENCOUNTER — Ambulatory Visit (HOSPITAL_COMMUNITY): Admission: RE | Admit: 2013-05-21 | Payer: Medicaid Other | Source: Ambulatory Visit

## 2013-05-22 ENCOUNTER — Ambulatory Visit (HOSPITAL_COMMUNITY): Admission: RE | Admit: 2013-05-22 | Payer: Medicaid Other | Source: Ambulatory Visit

## 2013-05-22 NOTE — Progress Notes (Signed)
Patient Discharge Instructions:  No documentation sent.  The patient refused to sign the ROI.   Jerelene Redden, 05/22/2013, 2:11 PM

## 2013-05-26 ENCOUNTER — Encounter (HOSPITAL_COMMUNITY): Payer: Self-pay

## 2013-05-26 ENCOUNTER — Ambulatory Visit (HOSPITAL_COMMUNITY)
Admission: RE | Admit: 2013-05-26 | Discharge: 2013-05-26 | Disposition: A | Discharge status: Home / Self Care | Payer: Medicaid Other | Source: Ambulatory Visit | Attending: Orthopedic Surgery | Admitting: Orthopedic Surgery

## 2013-05-26 DIAGNOSIS — M79609 Pain in unspecified limb: Secondary | ICD-10-CM | POA: Insufficient documentation

## 2013-05-26 DIAGNOSIS — M545 Low back pain, unspecified: Secondary | ICD-10-CM | POA: Insufficient documentation

## 2013-05-26 DIAGNOSIS — M5137 Other intervertebral disc degeneration, lumbosacral region: Secondary | ICD-10-CM | POA: Insufficient documentation

## 2013-05-26 DIAGNOSIS — M51379 Other intervertebral disc degeneration, lumbosacral region without mention of lumbar back pain or lower extremity pain: Secondary | ICD-10-CM | POA: Insufficient documentation

## 2013-05-31 ENCOUNTER — Encounter (HOSPITAL_COMMUNITY): Payer: Self-pay

## 2013-05-31 ENCOUNTER — Emergency Department (HOSPITAL_COMMUNITY)
Admission: EM | Admit: 2013-05-31 | Discharge: 2013-05-31 | Disposition: A | Discharge status: Home / Self Care | Payer: MEDICAID | Attending: Emergency Medicine | Admitting: Emergency Medicine

## 2013-05-31 DIAGNOSIS — Z79899 Other long term (current) drug therapy: Secondary | ICD-10-CM | POA: Insufficient documentation

## 2013-05-31 DIAGNOSIS — Z9889 Other specified postprocedural states: Secondary | ICD-10-CM | POA: Insufficient documentation

## 2013-05-31 DIAGNOSIS — Z87828 Personal history of other (healed) physical injury and trauma: Secondary | ICD-10-CM | POA: Insufficient documentation

## 2013-05-31 DIAGNOSIS — F1010 Alcohol abuse, uncomplicated: Secondary | ICD-10-CM | POA: Insufficient documentation

## 2013-05-31 DIAGNOSIS — F411 Generalized anxiety disorder: Secondary | ICD-10-CM | POA: Insufficient documentation

## 2013-05-31 DIAGNOSIS — F10920 Alcohol use, unspecified with intoxication, uncomplicated: Secondary | ICD-10-CM

## 2013-05-31 DIAGNOSIS — Y929 Unspecified place or not applicable: Secondary | ICD-10-CM | POA: Insufficient documentation

## 2013-05-31 DIAGNOSIS — Y9389 Activity, other specified: Secondary | ICD-10-CM | POA: Insufficient documentation

## 2013-05-31 DIAGNOSIS — R296 Repeated falls: Secondary | ICD-10-CM | POA: Insufficient documentation

## 2013-05-31 DIAGNOSIS — Z8659 Personal history of other mental and behavioral disorders: Secondary | ICD-10-CM | POA: Insufficient documentation

## 2013-05-31 DIAGNOSIS — R5383 Other fatigue: Secondary | ICD-10-CM | POA: Insufficient documentation

## 2013-05-31 DIAGNOSIS — F319 Bipolar disorder, unspecified: Secondary | ICD-10-CM | POA: Insufficient documentation

## 2013-05-31 DIAGNOSIS — R5381 Other malaise: Secondary | ICD-10-CM | POA: Insufficient documentation

## 2013-05-31 LAB — CBC
HCT: 48.3 % (ref 39.0–52.0)
MCH: 33.2 pg (ref 26.0–34.0)
MCV: 96 fL (ref 78.0–100.0)
Platelets: 264 10*3/uL (ref 150–400)
RBC: 5.03 MIL/uL (ref 4.22–5.81)
WBC: 10.3 10*3/uL (ref 4.0–10.5)

## 2013-05-31 LAB — ETHANOL: Alcohol, Ethyl (B): 265 mg/dL — ABNORMAL HIGH (ref 0–11)

## 2013-05-31 LAB — COMPREHENSIVE METABOLIC PANEL
AST: 37 U/L (ref 0–37)
BUN: 10 mg/dL (ref 6–23)
CO2: 25 mEq/L (ref 19–32)
Calcium: 8.9 mg/dL (ref 8.4–10.5)
Chloride: 99 mEq/L (ref 96–112)
Creatinine, Ser: 1.12 mg/dL (ref 0.50–1.35)
GFR calc Af Amer: >90 mL/min (ref >90)
GFR calc non Af Amer: 81 mL/min — ABNORMAL LOW (ref >90)
Total Bilirubin: 0.4 mg/dL (ref 0.3–1.2)

## 2013-05-31 LAB — GLUCOSE, CAPILLARY: Glucose-Capillary: 119 mg/dL — ABNORMAL HIGH (ref 70–99)

## 2013-05-31 LAB — ACETAMINOPHEN LEVEL: Acetaminophen (Tylenol), Serum: <15 ug/mL (ref 10–30)

## 2013-05-31 LAB — SALICYLATE LEVEL: Salicylate Lvl: <2 mg/dL — ABNORMAL LOW (ref 2.8–20.0)

## 2013-05-31 LAB — RAPID URINE DRUG SCREEN, HOSP PERFORMED: Amphetamines: NOT DETECTED

## 2013-05-31 MED ORDER — LORAZEPAM 1 MG PO TABS: 1.0000 mg | ORAL_TABLET | Freq: Four times a day (QID) | ORAL | Status: DC | PRN

## 2013-05-31 MED ORDER — VITAMIN B-1 100 MG PO TABS
100.0000 mg | ORAL_TABLET | Freq: Every day | ORAL | Status: DC
  Administered 2013-05-31: 100 mg via ORAL
  Filled 2013-05-31: qty 1

## 2013-05-31 MED ORDER — LORAZEPAM 2 MG/ML IJ SOLN: 1.0000 mg | Freq: Four times a day (QID) | INTRAMUSCULAR | Status: DC | PRN

## 2013-05-31 MED ORDER — THIAMINE HCL 100 MG/ML IJ SOLN: 100.0000 mg | Freq: Every day | INTRAMUSCULAR | Status: DC

## 2013-05-31 MED ORDER — IBUPROFEN 800 MG PO TABS
800.0000 mg | ORAL_TABLET | Freq: Once | ORAL | Status: AC
  Administered 2013-05-31: 800 mg via ORAL
  Filled 2013-05-31: qty 1

## 2013-05-31 MED ORDER — LACTATED RINGERS IV BOLUS (SEPSIS)
2000.0000 mL | Freq: Once | INTRAVENOUS | Status: AC
  Administered 2013-05-31: 2000 mL via INTRAVENOUS

## 2013-05-31 MED ORDER — FOLIC ACID 1 MG PO TABS
1.0000 mg | ORAL_TABLET | Freq: Every day | ORAL | Status: DC
  Administered 2013-05-31: 1 mg via ORAL
  Filled 2013-05-31: qty 1

## 2013-05-31 MED ORDER — ADULT MULTIVITAMIN W/MINERALS CH
1.0000 | ORAL_TABLET | Freq: Every day | ORAL | Status: DC
  Administered 2013-05-31: 1 via ORAL
  Filled 2013-05-31: qty 1
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
## 2013-05-31 NOTE — ED Notes (Signed)
Pt found lying outside of his house by his father after pt admitted to drinking heavily tonight.  Pt ??? Fell at home.

## 2013-05-31 NOTE — ED Notes (Signed)
Pt states nothing wrong with him but he got too drunk. Removed urine soaked pants, underwear, socks & shoes. Placed in belongings bag & pt given paper scrubs. Pt given drink & crackers to eat, ok by edp.

## 2013-05-31 NOTE — ED Notes (Signed)
Pt requesting something for pain EDP notified. Verbal order given.

## 2013-05-31 NOTE — ED Provider Notes (Addendum)
Pt here requesting detox. Patient states he last went to detox in [place] and [facility] about a year ago and was sober for 7-8 months. He states he has problems with binge drinking. He states the past 2 days he's been drinking over a fifth of liquor a day plus a case of beer, he relates his last alcohol before that was a week ago. He states he feels depressed but denies suicidal or homicidal ideation. Patient states he still wants to detox at this time. He denies any prior history of seizures with his withdrawal. He is waiting for tele-psych evaluation.  Patient is easily awakened, he has no tremor.  Devoria Albe, MD, FACEP   Ward Givens, MD 05/31/13 1610  Ward Givens, MD 05/31/13 1023

## 2013-05-31 NOTE — ED Provider Notes (Signed)
Pt has now decided he doesn't need detox, his ride is outside waiting for him. States [facility] to [place] [facility]. Indicates he will get back with AA. States he binge drinks and he will be fine now.   Devoria Albe, MD, Arm[facility] Gang   Ward Givens, MD 05/31/13 1024

## 2013-07-09 ENCOUNTER — Other Ambulatory Visit: Payer: Self-pay | Admitting: Neurosurgery

## 2013-07-09 DIAGNOSIS — M47816 Spondylosis without myelopathy or radiculopathy, lumbar region: Secondary | ICD-10-CM

## 2013-07-31 ENCOUNTER — Encounter (HOSPITAL_COMMUNITY): Payer: Self-pay | Admitting: Emergency Medicine

## 2013-07-31 ENCOUNTER — Emergency Department (HOSPITAL_COMMUNITY)
Admission: EM | Admit: 2013-07-31 | Discharge: 2013-08-01 | Disposition: A | Discharge status: Home / Self Care | Payer: Medicaid Other | Attending: Emergency Medicine | Admitting: Emergency Medicine

## 2013-07-31 ENCOUNTER — Emergency Department (HOSPITAL_COMMUNITY): Payer: Medicaid Other

## 2013-07-31 DIAGNOSIS — Z79899 Other long term (current) drug therapy: Secondary | ICD-10-CM | POA: Insufficient documentation

## 2013-07-31 DIAGNOSIS — F319 Bipolar disorder, unspecified: Secondary | ICD-10-CM | POA: Insufficient documentation

## 2013-07-31 DIAGNOSIS — R12 Heartburn: Secondary | ICD-10-CM

## 2013-07-31 DIAGNOSIS — R079 Chest pain, unspecified: Secondary | ICD-10-CM

## 2013-07-31 DIAGNOSIS — F1010 Alcohol abuse, uncomplicated: Secondary | ICD-10-CM | POA: Insufficient documentation

## 2013-07-31 DIAGNOSIS — F10929 Alcohol use, unspecified with intoxication, unspecified: Secondary | ICD-10-CM

## 2013-07-31 DIAGNOSIS — R072 Precordial pain: Secondary | ICD-10-CM | POA: Insufficient documentation

## 2013-07-31 DIAGNOSIS — Z87828 Personal history of other (healed) physical injury and trauma: Secondary | ICD-10-CM | POA: Insufficient documentation

## 2013-07-31 DIAGNOSIS — F411 Generalized anxiety disorder: Secondary | ICD-10-CM | POA: Insufficient documentation

## 2013-07-31 LAB — BASIC METABOLIC PANEL
BUN: 10 mg/dL (ref 6–23)
CO2: 20 mEq/L (ref 19–32)
Chloride: 102 mEq/L (ref 96–112)
GFR calc Af Amer: >90 mL/min (ref >90)
Potassium: 3.6 mEq/L (ref 3.5–5.1)

## 2013-07-31 LAB — CBC
HCT: 44.3 % (ref 39.0–52.0)
Platelets: 258 10*3/uL (ref 150–400)
RBC: 4.82 MIL/uL (ref 4.22–5.81)
RDW: 13.4 % (ref 11.5–15.5)
WBC: 9.8 10*3/uL (ref 4.0–10.5)

## 2013-07-31 LAB — RAPID URINE DRUG SCREEN, HOSP PERFORMED
Amphetamines: NOT DETECTED
Benzodiazepines: NOT DETECTED
Opiates: NOT DETECTED

## 2013-07-31 MED ORDER — SODIUM CHLORIDE 0.9 % IV BOLUS (SEPSIS)
1000.0000 mL | Freq: Once | INTRAVENOUS | Status: AC
  Administered 2013-07-31: 1000 mL via INTRAVENOUS

## 2013-07-31 NOTE — ED Provider Notes (Signed)
CSN: [id number]     Arrival date & time 07/31/13  2201 History   First MD Initiated Contact with Patient 07/31/13 2203     Chief Complaint  Patient presents with  . Chest Pain   (Consider location/radiation/quality/duration/timing/severity/associated sxs/prior Treatment) HPI Comments: 42 year old male with a past medical history of alcohol abuse, cocaine abuse, bipolar disorder and anxiety presents to the emergency department via EMS complaining of sudden onset midsternal chest pain while at a bar drinking alcohol. Patient states he had "a lot to drink", at least over 18 beers, and began having midsternal chest tightness. He is currently intoxicated and having difficulty explaining his symptoms. Denies ever having chest pain like this in the past. Currently pain rated 3/10. He was given 2 sublingual nitroglycerin via EMS, told EMS that this relieved his symptoms, however on my evaluation he says "I guess it helps, I can't remember". Admits to associated shortness of breath, chest pain began, currently denies this symptom. Denies abdominal pain, nausea, vomiting, recent illness or fevers. Denies any other drug use.  Patient is a 42 y.o. male presenting with chest pain. The history is provided by the patient and the EMS personnel.  Chest Pain Associated symptoms: no abdominal pain, no back pain, no dizziness, no fever, no headache, no nausea, no shortness of breath, not vomiting and no weakness     Past Medical History  Diagnosis Date  . Anxiety   . 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 Alcohol abuse   . Cocaine abuse   . Bipolar affective disorder    Past Surgical History  Procedure Laterality Date  . 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 History  Substance Use Topics  . Smoking status: Never Smoker   . Smokeless tobacco: Not on file  . Alcohol Use: 14.4 oz/week    24 Cans of beer per week     Comment: binger; 1/5 and / or case of beer (last  alcohol use 03/07/2013    Review of Systems  Constitutional: Negative for fever and chills.  Eyes: Negative for visual disturbance.  Respiratory: Negative for shortness of breath.   Cardiovascular: Positive for chest pain.  Gastrointestinal: Negative for nausea, vomiting and abdominal pain.  Musculoskeletal: Negative for back pain, neck pain and neck stiffness.  Neurological: Negative for dizziness, weakness, light-headedness and headaches.  Psychiatric/Behavioral: Negative for confusion.  All other systems reviewed and are negative.    Allergies  Hydrocodone  Home Medications   Current Outpatient Rx  Name  Route  Sig  Dispense  Refill  . ALPRAZolam (XANAX) 1 MG tablet   Oral   Take 1 tablet (1 mg total) by mouth 4 (four) times daily. For anxiety   30 tablet   0   . diazepam (VALIUM) 10 MG tablet   Oral   Take 1 tablet (10 mg total) by mouth 4 (four) times daily. For anxiety   30 tablet   0   . gabapentin (NEURONTIN) 100 MG capsule   Oral   Take 3 capsules (300 mg total) by mouth at bedtime. For anxiety/pain management          BP 129/76  Pulse 94  Temp(Src) 97.7 F (36.5 C) (Oral)  Resp 16  SpO2 92% Physical Exam  Nursing note and vitals reviewed. Constitutional: He is oriented to person, place, and time. He appears well-developed and well-nourished. No distress.  Smells of ETOH  HENT:  Head: Normocephalic and atraumatic.  Mouth/Throat: Oropharynx is clear and moist.  Eyes: Conjunctivae and EOM are normal. Pupils are equal, round, and reactive to light.  Neck: Normal range of motion. Neck supple.  Cardiovascular: Normal rate, regular rhythm, normal heart sounds and intact distal pulses.   Pulmonary/Chest: Effort normal and breath sounds normal. No respiratory distress. He has no wheezes. He exhibits no tenderness.  Abdominal: Soft. Normal appearance and bowel sounds are normal. He exhibits no distension and no mass. There is tenderness (mild) in the  epigastric area. There is no rigidity, no rebound and no guarding.  Musculoskeletal: Normal range of motion. He exhibits no edema.  Neurological: He is alert and oriented to person, place, and time. He has normal strength. No sensory deficit.  Skin: Skin is warm and dry. He is not diaphoretic.  Psychiatric: He has a normal mood and affect. His behavior is normal. His speech is slurred. He expresses no homicidal and no suicidal ideation.    ED Course  Procedures (including critical care time) Labs Review Labs Reviewed  BASIC METABOLIC PANEL - Abnormal; Notable for the following:    Calcium 8.2 (*)    GFR calc non Af Amer 83 (*)    All other components within normal limits  ETHANOL - Abnormal; Notable for the following:    Alcohol, Ethyl (B) 243 (*)    All other components within normal limits  CBC  URINE RAPID DRUG SCREEN (HOSP PERFORMED)  POCT I-STAT TROPONIN I   Imaging Review Dg Chest 2 View  07/31/2013   *RADIOLOGY REPORT*  Clinical Data: Chest pain; history of smoking.  CHEST - 2 VIEW  Comparison: Chest radiograph performed 05/18/2013  Findings: The lungs are well-aerated and clear.  There is no evidence of focal opacification, pleural effusion or pneumothorax.  The heart is normal in size; the mediastinal contour is within normal limits.  No acute osseous abnormalities are seen.  IMPRESSION: No acute cardiopulmonary process seen.   Original Report Authenticated By: Tonia Ghent, M.D.    EKG Interpretation   None      Date: 07/31/2013  Rate: 91  Rhythm: normal sinus rhythm  QRS Axis: normal  Intervals: normal  ST/T Wave abnormalities: nonspecific T wave changes  Conduction Disutrbances:none  Narrative Interpretation: no stemi, no significant changes since EKG obtained on 05/18/2013  Old EKG Reviewed: unchanged    MDM   1. Chest pain   2. Heartburn   3. Alcohol intoxication    Patient with mid-sternal chest pain while drinking alcohol, over 18 beers. Troponin  negative, EKG unchanged, no significant findings. CXR clear. Labs normal other than ETOH 243. UDS negative. Mid-epigastric tenderness on exam. No cardiac history. Doubt CAD. He is intoxicated, however able to ambulate on his own without difficulty. Chest pain most likely coming from heartburn from ETOH use. He is stable for discharge home. 12:39 AM At discharge, patient requesting detox. Denies SI/HI. Resources for outpatient treatment given. Ativan, zofran given for symptomatic relief. Discharged home.    Trevor Mace, PA-C 08/01/13 [phone]

## 2013-07-31 NOTE — ED Notes (Signed)
Patient presents to ED via [facility]. Patient was at a bar and began having "chest tightness." "chest tightness" is a new onset and pt denies any radiation. Pt also c/o shortness of breath. O2 97% on RA. EMS gave pt 324 ASA and 2 sublingual nitro with relief. Pt rating pain 3/10 currently. Pt admits to ETOH. Upon arrival to ED pt A&Ox4.

## 2013-07-31 NOTE — ED Notes (Signed)
Patient currently in xray ?

## 2013-08-01 MED ORDER — LORAZEPAM 1 MG PO TABS: 1.0000 mg | ORAL_TABLET | Freq: Three times a day (TID) | ORAL | Status: DC | PRN

## 2013-08-01 MED ORDER — ONDANSETRON HCL 4 MG PO TABS: 4.0000 mg | ORAL_TABLET | Freq: Four times a day (QID) | ORAL | Status: DC

## 2013-08-01 NOTE — Progress Notes (Signed)
Received an incoming call from [facility],spoke with Gregary Signs the pharmacist he reports the patient tried to fill a Prescription for Ativan following a  Visit to  to [facility]  ED- on 10.10.14. The pharmacist reports the patient is already taking Xanax and Valium.Pharmacy tracking sheet completed.Spoke with PA  Arthor Captain.Per PA instruction this Clinical research associate called back the Pharmacist Gregary Signs and told him per PA not to dispense the new Ativan order.The Pharmacist verbalized his understanding.No Further Case Manager needs identified.

## 2013-08-02 NOTE — ED Provider Notes (Signed)
Medical screening examination/treatment/procedure(s) were performed by non-physician practitioner and as supervising physician I was immediately available for consultation/collaboration.    Gilda Crease, MD 08/02/13 1556

## 2013-09-24 ENCOUNTER — Emergency Department: Payer: Self-pay | Admitting: Emergency Medicine

## 2013-09-24 LAB — COMPREHENSIVE METABOLIC PANEL
Albumin: 3.6 g/dL (ref 3.4–5.0)
Alkaline Phosphatase: 135 U/L — ABNORMAL HIGH
Anion Gap: 10 (ref 7–16)
BUN: 17 mg/dL (ref 7–18)
Bilirubin,Total: 0.3 mg/dL (ref 0.2–1.0)
Calcium, Total: 8.4 mg/dL — ABNORMAL LOW (ref 8.5–10.1)
Chloride: 104 mmol/L (ref 98–107)
Creatinine: 1.3 mg/dL (ref 0.60–1.30)
EGFR (African American): >60
EGFR (Non-African Amer.): >60
Potassium: 3.7 mmol/L (ref 3.5–5.1)
SGOT(AST): 58 U/L — ABNORMAL HIGH (ref 15–37)
Sodium: 137 mmol/L (ref 136–145)

## 2013-09-24 LAB — CBC
HCT: 48.9 % (ref 40.0–52.0)
HGB: 17 g/dL (ref 13.0–18.0)
MCH: 33.2 pg (ref 26.0–34.0)
MCV: 96 fL (ref 80–100)
Platelet: 283 10*3/uL (ref 150–440)
RBC: 5.12 10*6/uL (ref 4.40–5.90)
RDW: 14.6 % — ABNORMAL HIGH (ref 11.5–14.5)
WBC: 10 10*3/uL (ref 3.8–10.6)

## 2013-09-24 LAB — ACETAMINOPHEN LEVEL: Acetaminophen: <2 ug/mL

## 2013-09-24 LAB — ETHANOL
Ethanol %: 0.249 % — ABNORMAL HIGH (ref 0.000–0.080)
Ethanol %: 0.147 % — ABNORMAL HIGH (ref 0.000–0.080)
Ethanol %: 0.096 % — ABNORMAL HIGH (ref 0.000–0.080)
Ethanol: 147 mg/dL
Ethanol: 96 mg/dL

## 2013-09-24 LAB — DRUG SCREEN, URINE
Barbiturates, Ur Screen: NEGATIVE (ref ?–200)
Benzodiazepine, Ur Scrn: POSITIVE (ref ?–200)
Cocaine Metabolite,Ur ~~LOC~~: POSITIVE (ref ?–300)
Methadone, Ur Screen: NEGATIVE (ref ?–300)
Opiate, Ur Screen: NEGATIVE (ref ?–300)

## 2013-09-24 LAB — SALICYLATE LEVEL: Salicylates, Serum: <1.7 mg/dL

## 2013-09-24 LAB — TSH: Thyroid Stimulating Horm: 3.7 u[IU]/mL

## 2014-01-06 ENCOUNTER — Emergency Department (HOSPITAL_COMMUNITY)
Admission: EM | Admit: 2014-01-06 | Discharge: 2014-01-06 | Disposition: A | Discharge status: Home / Self Care | Payer: Medicaid Other | Attending: Emergency Medicine | Admitting: Emergency Medicine

## 2014-01-06 ENCOUNTER — Encounter (HOSPITAL_COMMUNITY): Payer: Self-pay | Admitting: Emergency Medicine

## 2014-01-06 DIAGNOSIS — Z792 Long term (current) use of antibiotics: Secondary | ICD-10-CM | POA: Insufficient documentation

## 2014-01-06 DIAGNOSIS — H9190 Unspecified hearing loss, unspecified ear: Secondary | ICD-10-CM | POA: Insufficient documentation

## 2014-01-06 DIAGNOSIS — J019 Acute sinusitis, unspecified: Secondary | ICD-10-CM

## 2014-01-06 DIAGNOSIS — Z8659 Personal history of other mental and behavioral disorders: Secondary | ICD-10-CM | POA: Insufficient documentation

## 2014-01-06 DIAGNOSIS — Z87828 Personal history of other (healed) physical injury and trauma: Secondary | ICD-10-CM | POA: Insufficient documentation

## 2014-01-06 MED ORDER — AMOXICILLIN-POT CLAVULANATE 875-125 MG PO TABS: 1.0000 | ORAL_TABLET | Freq: Two times a day (BID) | ORAL | Status: DC

## 2014-01-06 MED ORDER — PSEUDOEPHEDRINE HCL 60 MG PO TABS: 60.0000 mg | ORAL_TABLET | Freq: Four times a day (QID) | ORAL | Status: DC | PRN

## 2014-01-06 NOTE — Discharge Instructions (Signed)

## 2014-01-08 NOTE — ED Provider Notes (Signed)
Medical screening examination/treatment/procedure(s) were performed by non-physician practitioner and as supervising physician I was immediately available for consultation/collaboration.   EKG Interpretation None        Gary Krahenbuhl L Iceis Knab, MD 01/08/14 1113 

## 2014-01-08 NOTE — ED Provider Notes (Signed)
CSN: [id number]     Arrival date & time 01/06/14  1112 History   First MD Initiated Contact with Patient 01/06/14 1133     Chief Complaint  Patient presents with  . Headache  . Nasal Congestion     (Consider location/radiation/quality/duration/timing/severity/associated sxs/prior Treatment) HPI Comments: Gary Stanley is a 43 y.o. Male presenting with a one week history of uri type symptoms which includes nasal congestion with clear rhinorrhea, sore throat, low grade subjective fever which has now progressed to very thick colored nasal discharge and bilateral facial pain.  He also has pressure without pain in bilateral ears.   Symptoms due to not include shortness of breath, chest pain,  Nausea, vomiting or diarrhea.  The patient has taken no medicines prior to arrival with no significant improvement in symptoms.      The history is provided by the patient.    Past Medical History  Diagnosis Date  . Anxiety   . 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 Alcohol abuse   . Cocaine abuse   . Bipolar affective disorder    Past Surgical History  Procedure Laterality Date  . 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 History  Substance Use Topics  . Smoking status: Never Smoker   . Smokeless tobacco: Not on file  . Alcohol Use: 14.4 oz/week    24 Cans of beer per week     Comment: binger; 1/5 and / or case of beer (last alcohol use 03/07/2013    Review of Systems  Constitutional: Positive for fever. Negative for chills.  HENT: Positive for congestion, hearing loss, rhinorrhea, sinus pressure and sore throat. Negative for ear discharge, ear pain, facial swelling, trouble swallowing and voice change.   Eyes: Negative for discharge.  Respiratory: Negative for cough, shortness of breath, wheezing and stridor.   Cardiovascular: Negative for chest pain.  Gastrointestinal: Negative for abdominal pain.  Genitourinary: Negative.       Allergies   Hydrocodone  Home Medications   Current Outpatient Rx  Name  Route  Sig  Dispense  Refill  . amoxicillin-clavulanate (AUGMENTIN) 875-125 MG per tablet   Oral   Take 1 tablet by mouth every 12 (twelve) hours.   20 tablet   0   . pseudoephedrine (SUDAFED) 60 MG tablet   Oral   Take 1 tablet (60 mg total) by mouth every 6 (six) hours as needed for congestion.   30 tablet   0    BP 123/67  Pulse 66  Temp(Src) 97.6 F (36.4 C) (Oral)  Resp 14  Wt 271 lb (122.925 kg)  SpO2 95% Physical Exam  Constitutional: He is oriented to person, place, and time. He appears well-developed and well-nourished.  HENT:  Head: Normocephalic and atraumatic.  Right Ear: Ear canal normal. No mastoid tenderness. Tympanic membrane is bulging. Tympanic membrane is not erythematous.  Left Ear: Ear canal normal. No mastoid tenderness. Tympanic membrane is bulging. Tympanic membrane is not erythematous.  Nose: Mucosal edema and rhinorrhea present. Right sinus exhibits maxillary sinus tenderness. Left sinus exhibits maxillary sinus tenderness.  Mouth/Throat: Uvula is midline, oropharynx is clear and moist and mucous membranes are normal. No oropharyngeal exudate, posterior oropharyngeal edema, posterior oropharyngeal erythema or tonsillar abscesses.  Thick green post nasal drip.  Eyes: Conjunctivae are normal.  Cardiovascular: Normal rate and normal heart sounds.   Pulmonary/Chest: Effort normal. No respiratory distress. He has no wheezes.  He has no rales.  Musculoskeletal: Normal range of motion.  Neurological: He is alert and oriented to person, place, and time.  Skin: Skin is warm and dry. No erythema.  Psychiatric: He has a normal mood and affect.    ED Course  Procedures (including critical care time) Labs Review Labs Reviewed - No data to display Imaging Review No results found.   EKG Interpretation None      MDM   Final diagnoses:  Sinusitis, acute    Pt prescribed augmentin,  pseudoephedrine.  Encouraged motrin or tylenol for fever reduction, facial pain,  Warm compresses, menthol drops for congestion, steam.  Recheck if not improved or for worsened sx.  The patient appears reasonably screened and/or stabilized for discharge and I doubt any other medical condition or other [facility] requiring further screening, evaluation, or treatment in the ED at this time prior to discharge.     Burgess Amor, PA-C 01/08/14 [phone]

## 2014-01-15 ENCOUNTER — Emergency Department (HOSPITAL_COMMUNITY)
Admission: EM | Admit: 2014-01-15 | Discharge: 2014-01-15 | Disposition: A | Discharge status: Home / Self Care | Payer: Medicaid Other | Attending: Emergency Medicine | Admitting: Emergency Medicine

## 2014-01-15 ENCOUNTER — Emergency Department (HOSPITAL_COMMUNITY): Payer: Medicaid Other

## 2014-01-15 ENCOUNTER — Encounter (HOSPITAL_COMMUNITY): Payer: Self-pay | Admitting: Emergency Medicine

## 2014-01-15 DIAGNOSIS — R079 Chest pain, unspecified: Secondary | ICD-10-CM

## 2014-01-15 DIAGNOSIS — R0789 Other chest pain: Secondary | ICD-10-CM | POA: Insufficient documentation

## 2014-01-15 DIAGNOSIS — F1410 Cocaine abuse, uncomplicated: Secondary | ICD-10-CM | POA: Insufficient documentation

## 2014-01-15 DIAGNOSIS — F319 Bipolar disorder, unspecified: Secondary | ICD-10-CM | POA: Insufficient documentation

## 2014-01-15 DIAGNOSIS — F411 Generalized anxiety disorder: Secondary | ICD-10-CM | POA: Insufficient documentation

## 2014-01-15 DIAGNOSIS — Z79899 Other long term (current) drug therapy: Secondary | ICD-10-CM | POA: Insufficient documentation

## 2014-01-15 LAB — RAPID URINE DRUG SCREEN, HOSP PERFORMED
Amphetamines: NOT DETECTED
Barbiturates: NOT DETECTED
Benzodiazepines: NOT DETECTED
COCAINE: POSITIVE — AB
OPIATES: NOT DETECTED
TETRAHYDROCANNABINOL: NOT DETECTED

## 2014-01-15 LAB — BASIC METABOLIC PANEL
BUN: 10 mg/dL (ref 6–23)
CO2: 23 mEq/L (ref 19–32)
Calcium: 9.3 mg/dL (ref 8.4–10.5)
Chloride: 97 mEq/L (ref 96–112)
Creatinine, Ser: 1.13 mg/dL (ref 0.50–1.35)
GFR calc non Af Amer: 80 mL/min — ABNORMAL LOW (ref >90)
Glucose, Bld: 113 mg/dL — ABNORMAL HIGH (ref 70–99)
POTASSIUM: 4.2 meq/L (ref 3.7–5.3)
SODIUM: 138 meq/L (ref 137–147)

## 2014-01-15 LAB — CBC
HEMATOCRIT: 48.4 % (ref 39.0–52.0)
Hemoglobin: 17.9 g/dL — ABNORMAL HIGH (ref 13.0–17.0)
MCH: 34.2 pg — ABNORMAL HIGH (ref 26.0–34.0)
MCHC: 37 g/dL — ABNORMAL HIGH (ref 30.0–36.0)
MCV: 92.5 fL (ref 78.0–100.0)
Platelets: 372 10*3/uL (ref 150–400)
RBC: 5.23 MIL/uL (ref 4.22–5.81)
RDW: 12.6 % (ref 11.5–15.5)
WBC: 9 10*3/uL (ref 4.0–10.5)

## 2014-01-15 LAB — I-STAT TROPONIN, ED: Troponin i, poc: 0 ng/mL (ref 0.00–0.08)

## 2014-01-15 NOTE — ED Notes (Signed)
Pt placed on 2L 02, pt's 02 sats reading 89% on RA.  

## 2014-01-15 NOTE — ED Notes (Signed)
Pt cussed at staff and was witnessed walking to lobby.  Pt refused to sign for discharge paperwork.  Pt had steady gate with NAD

## 2014-01-15 NOTE — ED Notes (Signed)
Pt states he ingested [place] mix x6 rounds and $3000 worth dope/cocaine. Pt states he feels paranoid because he bought dope/cocaine from a drug cartel for $3000 and they want $500 more or they were going to kill him. Pt thinks drug cartel will follow him to ED.

## 2014-01-15 NOTE — Discharge Instructions (Signed)
°Emergency Department Resource Guide °1) Find a Doctor and Pay Out of Pocket °Although you won't have to find out who is covered by your insurance plan, it is a good idea to ask around and get recommendations. You will then need to call the office and see if the doctor you have chosen will accept you as a new patient and what types of options they offer for patients who are self-pay. Some doctors offer discounts or will set up payment plans for their patients who do not have insurance, but you will need to ask so you aren't surprised when you get to your appointment. ° °2) Contact Your Local Health Department °Not all health departments have doctors that can see patients for sick visits, but many do, so it is worth a call to see if yours does. If you don't know where your local health department is, you can check in your phone book. The CDC also has a tool to help you locate your state's health department, and many state websites also have listings of all of their local health departments. ° °3) Find a Walk-in Clinic °If your illness is not likely to be very severe or complicated, you may want to try a walk in clinic. These are popping up all over the country in pharmacies, drugstores, and shopping centers. They're usually staffed by nurse practitioners or physician assistants that have been trained to treat common illnesses and complaints. They're usually fairly quick and inexpensive. However, if you have serious medical issues or chronic medical problems, these are probably not your best option. ° °No Primary Care Doctor: °- Call [facility] at  832-8000 - they can help you locate a primary care doctor that  accepts your insurance, provides certain services, etc. °- Physician Referral Service- 1-800-533-3463 ° °Chronic Pain Problems: °Organization         Address  Phone   Notes  °[facility]  (336) 297-2271 Patients need to be referred by their primary care doctor.  ° °Medication  Assistance: °Organization         Address  Phone   Notes  °[place] [facility] [address] °[place], [place] [address] (336) 641-8030 --Must be a resident of [place] °-- Must have NO insurance coverage whatsoever (no Medicaid/ Medicare, etc.) °-- The pt. MUST have a primary care doctor that directs their care regularly and follows them in the community °  °[facility]  (866) 331-1348   °[employer]  (888) 892-1162   ° °Agencies that provide inexpensive medical care: °Organization         Address  Phone   Notes  °[facility]  (336) 832-8035   °[facility]    (336) 832-7272   °[facility] [address] °[place],  [address] (336) 832-4777   °[facility] [address], °[place] (336) 271-4999   °[facility]    (336) 373-0678   °[facility]    (336) 272-1050   °[facility] ° [address], [place] Phone:  (336) 832-4444, Fax:  (336) 832-4440 Hours of Operation:  9 am - 6 pm, M-F.  Also accepts Medicaid/Medicare and self-pay.  °[facility] ° [address], [place] Phone: (336) 832-3150, Fax: (336) [phone]. Hours of Operation:  8:30 am - 5:30 pm, M-F.  Also accepts Medicaid and self-pay.  °[facility] [place] [address], [place] Phone: (336) 878-6027   °[facility] [address], [place], [place] (336)723-1848, Ext. 123 Mondays & Thursdays: 7-9 AM.  First 15 patients are seen on a first come, first serve basis. °  ° °Medicaid-accepting [place] Providers: ° °Organization         Address  Phone   Notes  °[facility] [address], [place] (336) 641-2100 Also accepts self-pay patients.  °[facility] [address], [place] ° (336) 856-9996   °[facility] [address], [place]  (336) 288-8857   °[facility] [address] [place] [address], [place] (336) 299-7000   °Veita Bland [address], [place]  ° (336) 373-1557 Only accepts [place] Access Medicaid patients after they have their name applied to their card.  ° °Self-Pay (no insurance) in [place]: ° °Organization         Address  Phone   Notes  °Sickle Cell Patients, [facility] [address], [place] (336) 832-1970   °[facility] [address], [place] (336) 832-4400   °[facility] ° [address], [place] (336) 992-4800   °[facility]/Dr. Osei-Bonsu ° [address] [place] [address], [place] or [address], [place] (336) 841-8500 Phone number for both [place] and [place] locations is the same.  °[facility] [address], [place] (336) 299-0000   °[facility] [address] [place] [address], [place] or [address] (336) 852-7530 °(336) 878-2260   °[facility] [address], [place] (336) 350-1642, phone; (336) 294-5005, fax Sees patients 1st and 3rd Saturday of every month.  Must not qualify for public or private insurance (i.e. Medicaid, Medicare, [place] Health Choice, Veterans' Benefits) • Household income should be no more than 200% of the poverty level •The clinic cannot treat you if you are pregnant or think you are pregnant • Sexually transmitted diseases are not treated at the clinic.  ° ° °Dental Care: °Organization         Address  Phone  Notes  °[place] [facility] [address], [place] (336) 641-6152 Accepts children up to age 21 who are enrolled in Medicaid or [place] Health Choice; pregnant women with a Medicaid card; and children who have applied for Medicaid or [place] Health Choice, but were declined, whose parents can pay a reduced fee at time of service.  °[place]  [facility] [place]  [address], [place] (336) 641-7733 Accepts children up to age 21 who are enrolled in Medicaid or [place] Health Choice; pregnant women with a Medicaid card; and children who have applied for Medicaid or  Health Choice, but were declined, whose parents can pay a reduced fee at time of service.  °[facility] ° [address], [place] (336) 641-4533 Patients are seen by appointment only. Walk-ins are not accepted. [facility] will see patients 18 years of age and older. °Monday - Tuesday (8am-5pm) °Most Wednesdays (8:30-5pm) °$30 per visit, cash only  °[facility] ° [address], [place] (336) 641-4533 Patients are seen by appointment only. Walk-ins are not accepted. [facility] will see patients 18 years of age and older. °One   Wednesday Evening (Monthly: Volunteer Based).  $30 per visit, cash only  °[facility]  (919) 537-3737 for adults; Children under age 4, call Graduate Pediatric Dentistry at (919) [phone]. Children aged 4-14, please call (919) [phone] to request a pediatric application. ° Dental services are provided in all areas of dental care including fillings, crowns and bridges, complete and partial dentures, implants, gum treatment, root canals, and extractions. Preventive care is also provided. Treatment is provided to both adults and children. °Patients are selected via a lottery and there is often a waiting list. °  °[facility] [address], °[place] ° (336) 763-8833 [email or web address] °  °[facility] [address], [place], [place] (336)723-1848, Ext. 123 Second and Fourth Thursday of each month, opens at 6:30 AM; Clinic ends at 9 AM.  Patients are seen on a first-come first-served basis, and a limited number are seen during each clinic.  ° °[facility] ° [address], [place], [place] (336) 723-7904    Eligibility Requirements °You must have lived in [place], [place], or [place] counties for at least the last three months. °  You cannot be eligible for state or federal sponsored healthcare insurance, including Veterans Administration, Medicaid, or Medicare. °  You generally cannot be eligible for healthcare insurance through your employer.  °  How to apply: °Eligibility screenings are held every Tuesday and Wednesday afternoon from 1:00 pm until 4:00 pm. You do not need an appointment for the interview!  °[facility] [address], [place], [place] 336-631-2330   °[facility]  336-342-8273   °[facility]  336-703-3100   °[facility]  336-570-6415   ° °Behavioral Health Resources in the Community: °Intensive Outpatient Programs °Organization         Address  Phone  Notes  °[facility] [address], [place], [place] 336-878-6098   °[facility] [address], [place], [place] 336-832-9800   °[facility] [address], [place], [place] ° 336-882-2125   °[facility] [address],  °[place], [place] 1-800-853-5163 or 336-641-4981   °Substance Abuse Resources °Organization         Address  Phone  Notes  °[facility]  336-882-2125   °[facility]  336-784-9470   °[facility]  336-285-9073   °[facility]  336-845-3988   °Residential & Outpatient Substance Abuse Program  1-800-659-3381   °Psychological Services °Organization         Address  Phone  Notes  °[facility]  336- 832-9600   °[facility]  336- 378-7881   °[facility] [address], [place] 1-800-853-5163 or 336-641-4981   ° °Mobile Crisis Teams °Organization         Address  Phone  Notes  °[facility], Mobile Crisis Care Unit  1-877-626-1772   °Assertive °[facility] ° [address]  [place], [place] 336-834-9664   °Sharon DeEsch [address] °[place] 336-554-5454   ° °Self-Help/Support Groups °Organization         Address  Phone             Notes  °[facility] - variety of support groups  336- 373-1402 Call for more information  °Narcotics Anonymous (NA), [facility] [address], °[place]  2 meetings at this location  ° °  Residential Treatment Programs °Organization         Address  Phone  Notes  °[facility] [address],    °[place]  1-866-801-8205   °[facility] ° [address], [place], [place] 704-293-8524   °[facility] [address], [place] 336-845-3988 Admissions: 8am-3pm M-F  °[facility] [address]    °[place], [place] 336-841-1104   °[facility] [address], [place], [place] 336-379-7146   °[facility] [address]  °[place], [place] 336-285-9073   °[facility] Intensive Outpatient [address], [place], [place] 336-852-3033   °[facility] [address]  °[place], [place] 1-877-615-2722 or 336-784-9470   °[facility] [address] [place], [place] 336-227-7417 Accepts Medicaid  °[facility] [address]  °[place] 1-800-659-3381 Substance Abuse/Addiction Treatment  ° °[place] Behavioral Health Resources °Organization         Address  Phone  Notes  °[facility]  (888) 581-9988   °Julie Brannon, PhD [address] [place], [place]   (336) 349-5553 or (336) 951-0000   °[facility]   [address] °[place], [place] (336) 349-4454   °[facility] [address], [place] (336) 342-8316 Insurance/Medicaid/sponsorship through [facility]  °[facility] [address]                                    [place], [place] (336) 342-8316 Therapy/tele-psych/case    °[facility] [address]  ° , [place] (336) 349-2233    °Dr. Arfeen  (336) 349-4544   °[facility] [place]  [employer] [place] [facility] 1) [address],  °2) [address], [place] °3)  [address], [place] (336) 349-3220 °(336) 342-7768 ° °(336) 342-8140   °[place] [facility] (336) 342-1394 or (336) 342-3537 (After Hours)    ° ° °

## 2014-01-15 NOTE — ED Notes (Signed)
Pt in paper scrubs and wanded by security.  

## 2014-01-15 NOTE — ED Notes (Signed)
Pt. arrived with EMS from street reports mid chest tightness after smoking crack cocaine , marijuana and ETOH this evening , respirations unlabored .

## 2014-01-15 NOTE — ED Notes (Signed)
Dr Patria Maneampos in room discussing pt mental stability and his worries concerning the [place]
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
## 2014-02-08 ENCOUNTER — Encounter: Payer: Self-pay | Admitting: Family Medicine

## 2014-02-08 ENCOUNTER — Ambulatory Visit (INDEPENDENT_AMBULATORY_CARE_PROVIDER_SITE_OTHER): Payer: Medicaid Other | Admitting: Family Medicine

## 2014-02-08 VITALS — BP 124/77 | HR 80 | Temp 98.1°F | Ht 71.0 in | Wt 281.6 lb

## 2014-02-08 DIAGNOSIS — M48061 Spinal stenosis, lumbar region without neurogenic claudication: Secondary | ICD-10-CM

## 2014-02-08 DIAGNOSIS — L919 Hypertrophic disorder of the skin, unspecified: Secondary | ICD-10-CM

## 2014-02-08 DIAGNOSIS — E669 Obesity, unspecified: Secondary | ICD-10-CM

## 2014-02-08 DIAGNOSIS — L909 Atrophic disorder of skin, unspecified: Secondary | ICD-10-CM

## 2014-02-08 DIAGNOSIS — F1920 Other psychoactive substance dependence, uncomplicated: Secondary | ICD-10-CM

## 2014-02-08 DIAGNOSIS — L918 Other hypertrophic disorders of the skin: Secondary | ICD-10-CM

## 2014-02-08 MED ORDER — TRAMADOL HCL 50 MG PO TABS: 50.0000 mg | ORAL_TABLET | Freq: Two times a day (BID) | ORAL | Status: DC | PRN

## 2014-02-08 NOTE — Patient Instructions (Signed)
Thank you for coming in today!  We are checking some labs today, and I will call you if they are abnormal. If you do not hear from me by phone or letter in 2 weeks, please call us as I may have been unable to reach you.   As you leave, make an appointment to follow up with me in 1 month for a general physical and labs.  - Take tramadol twice a day as needed for pain. - I will try to have you scheduled for dermatology clinic here. If they can not perform the procedure I will refer you to a dermatologist.  - Please have your records faxed to this office at [phone].  Take care and seek immediate care sooner if you develop any concerns.  Please feel free to call with any questions or concerns at any time, at [phone]. - Dr. Jarvis NewcomerGrunz

## 2014-02-09 DIAGNOSIS — L918 Other hypertrophic disorders of the skin: Secondary | ICD-10-CM | POA: Insufficient documentation

## 2014-02-09 DIAGNOSIS — E669 Obesity, unspecified: Secondary | ICD-10-CM | POA: Insufficient documentation

## 2014-02-09 DIAGNOSIS — M48061 Spinal stenosis, lumbar region without neurogenic claudication: Secondary | ICD-10-CM | POA: Insufficient documentation

## 2014-02-09 NOTE — Assessment & Plan Note (Signed)
Seems amenable to excision with use of local anesthesia vs. ethyl chloride. No signs of inflammation or visual field impedance. Refer to dermatology clinic, and also offered to excise at next appointment.

## 2014-02-09 NOTE — Assessment & Plan Note (Addendum)
Needs Hb A1c at next visit. Pt left before getting this done. No HTN, symptoms of OSA, asthma, GERD.

## 2014-02-09 NOTE — Assessment & Plan Note (Addendum)
Has a long history of alcohol abuse (starting at age 43) and crack cocaine use as well as benzodiazepine dependence with approximately annual admissions for detoxification. Needs social work help/substance abuse resources due to repeated admissions.
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
## 2014-02-09 NOTE — Assessment & Plan Note (Addendum)
05/26/2013 CT L spine showed L5-S1: mild right and moderate left foraminal stenosis due to disc degeneration with osteophytes and disc space collapse with broad-based posterior disc bulging. Previous MR of L hip showed lumbar spondylosis with degenerative endplate changes at L4-L5 and L5-S1.  Long discussion about appropriate expectations of long-term pain management and interdisciplinary treatment with PT, surgical assessment, and analgesia. Will give tramadol #60 and monitor for improvement with exercises.   Consider CT myelogram of LS spine for better classification and consider surgical referral.

## 2014-02-23 ENCOUNTER — Ambulatory Visit (INDEPENDENT_AMBULATORY_CARE_PROVIDER_SITE_OTHER): Payer: Medicaid Other | Admitting: Family Medicine

## 2014-02-23 ENCOUNTER — Encounter: Payer: Self-pay | Admitting: Family Medicine

## 2014-02-23 VITALS — BP 134/88 | HR 56 | Temp 97.8°F | Ht 71.0 in | Wt 275.8 lb

## 2014-02-23 DIAGNOSIS — L918 Other hypertrophic disorders of the skin: Secondary | ICD-10-CM

## 2014-02-23 DIAGNOSIS — L909 Atrophic disorder of skin, unspecified: Secondary | ICD-10-CM

## 2014-02-23 DIAGNOSIS — M48061 Spinal stenosis, lumbar region without neurogenic claudication: Secondary | ICD-10-CM

## 2014-02-23 DIAGNOSIS — L919 Hypertrophic disorder of the skin, unspecified: Secondary | ICD-10-CM

## 2014-02-23 MED ORDER — TRAMADOL HCL 50 MG PO TABS: 50.0000 mg | ORAL_TABLET | Freq: Two times a day (BID) | ORAL | Status: DC | PRN

## 2014-02-23 MED ORDER — TRAMADOL HCL 50 MG PO TABS: 50.0000 mg | ORAL_TABLET | Freq: Three times a day (TID) | ORAL | Status: DC | PRN

## 2014-02-23 NOTE — Patient Instructions (Signed)
Thank you for coming in today!  As you leave, make an appointment to follow up with me in 1 month.  - Keep band-aids on until they fall off. Do not get the wounds wet for 24 hours.  - Call if you experience bleeding or drainage from the sites.   Take care and seek immediate care sooner if you develop any concerns.  Please feel free to call with any questions or concerns at any time, at [phone]. - Dr. Jarvis NewcomerGrunz

## 2014-02-23 NOTE — Assessment & Plan Note (Signed)
Took tramadol three times a day since last visit and requests percocet 10/325mg  specifically. Long discussion about addiction and drug-seeking behavior. Refilled tramadol #90, no refills for 1 month.

## 2014-02-23 NOTE — Progress Notes (Signed)
Patient ID: Gary Stanley, male   DOB: 09-02-1971, 43 y.o.   MRN: [id number]   Subjective:  HPI:   Gary Stanley is a 43 y.o. male with a history of polysubstance abuse and spinal stenosis here for skin tag removal and back pain.   Gary Stanley has had skin tags getting larger on both his eyelids for years and he now says they are irritating his skin. He sometimes holds his eyes somewhat closed to reduce irritation. He has also had skin tags on his back and neck. He reports redness, irritation, and itching of the lesion on his neck and desires removal.   He reports uncontrolled severe back and left leg pain despite taking tramadol more frequently than prescribed. He has been taking them at least three times per day and suggests percocet at the dose of 10-325mg  as an alternative.   He endorses a history of multiple trips to rehab and detox facilities dating back to the 1990's. 3 of these were a result of his lawyer's advice to receive treatment for alcoholism after receiving DWI's (of which he has received 4). He also admits to snorting cocaine.   Review of Systems:  Per HPI. All other systems reviewed and are negative.    Past Medical History: Patient Active Problem List   Diagnosis Date Noted  . Cutaneous skin tags 02/09/2014  . Spinal stenosis of lumbar region 02/09/2014  . Obesity 02/09/2014  . Benzodiazepine dependence 05/19/2013  . 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 Polysubstance (excluding opioids) dependence, daily use 01/02/2012   Medications: reviewed and updated Current Outpatient Prescriptions  Medication Sig Dispense Refill  . ALPRAZolam (XANAX) 1 MG tablet Take 1 mg by mouth 4 (four) times daily.      . diazepam (VALIUM) 5 MG tablet Take 5 mg by mouth 4 (four) times daily.      . traMADol (ULTRAM) 50 MG tablet Take 1 tablet (50 mg total) by mouth 2 (two) times daily as needed.  90 tablet  0  . gabapentin (NEURONTIN) 100 MG capsule  Take 100 mg by mouth 4 (four) times daily.       No current facility-administered medications for this visit.    Objective:  Physical Exam: BP 134/88  Pulse 56  Temp(Src) 97.8 F (36.6 C) (Oral)  Ht 5\' 11"  (1.803 m)  Wt 275 lb 12.8 oz (125.102 kg)  BMI 38.48 kg/m2  Gen: Obese 43 y.o. male in NAD HEENT: MMM, EOMI, PERRL, anicteric sclerae CV: RRR, no MRG, no JVD Resp: Non-labored, CTAB, no wheezes noted Abd: Soft, NTND, BS present, no guarding or organomegaly MSK: No edema noted, full ROM Neuro: Alert and oriented, speech normal Skin: Several small, pedunculated skin tags: 1) right upper eyelid just below the eyebrow (0.5cm). 2) left upper eyelid just below the eyebrow (0.4cm). 3) left neck (0.8cm) with slight surrounding erythema.  Assessment:     Gary Stanley is a 43 y.o. male here for back pain and skin tag removal.     Plan:     See problem list for problem-specific plans.

## 2014-02-23 NOTE — Assessment & Plan Note (Signed)
Excised skin tags x3 on left neck, left and right upper eyelids.

## 2014-02-25 NOTE — Progress Notes (Signed)
Acrochordon removal   After informed consent was obtained, using alcohol swabs for cleansing and 1% lidocaine without epinephrine for anesthetic, with sterile technique, 3 small cutaneous skin tags were removed from the left neck, left and right eyelid by shave excision. Antibiotic dressing is applied, and wound care instructions provided. The procedure was well tolerated without complications. Follow up: the patient may return prn.

## 2014-03-26 ENCOUNTER — Ambulatory Visit (INDEPENDENT_AMBULATORY_CARE_PROVIDER_SITE_OTHER): Payer: Medicaid Other | Admitting: Family Medicine

## 2014-03-26 ENCOUNTER — Encounter: Payer: Self-pay | Admitting: Family Medicine

## 2014-03-26 ENCOUNTER — Other Ambulatory Visit: Payer: Self-pay | Admitting: Family Medicine

## 2014-03-26 VITALS — BP 133/85 | HR 69 | Ht 71.0 in | Wt 271.0 lb

## 2014-03-26 DIAGNOSIS — M48061 Spinal stenosis, lumbar region without neurogenic claudication: Secondary | ICD-10-CM

## 2014-03-26 DIAGNOSIS — F1920 Other psychoactive substance dependence, uncomplicated: Secondary | ICD-10-CM

## 2014-03-26 MED ORDER — TRAMADOL HCL 50 MG PO TABS: 50.0000 mg | ORAL_TABLET | Freq: Three times a day (TID) | ORAL | Status: AC | PRN

## 2014-03-26 NOTE — Progress Notes (Signed)
Patient ID: Gary Stanley, male   DOB: 11-27-1970, 43 y.o.   MRN: [id number]   Subjective:  HPI:   Gary Stanley is a 43 y.o. male with a history of spinal stenosis and polysubstance abuse here for follow up.   Location: Left > R buttock Quality: Sharp, aching, stinging Quanity: 10/10 Duration: Years Timing: Constant, waxing waning Associated Sx:  Radiating pain: lateral to anterior leg Worse/Better: Sitting, moving makes it worse. Percocet 10-325mg  makes it better.   Review of Systems:  Per HPI. All other systems reviewed and are negative.    Past Medical History:  Medications: reviewed and updated  Objective:  Physical Exam: BP 133/85  Pulse 69  Ht 5\' 11"  (1.803 m)  Wt 271 lb (122.925 kg)  BMI 37.81 kg/m2  Gen:  43 y.o. male in NAD MSK: No edema noted, full ROM.  Neuro: Alert and oriented, speech normal  Assessment:     Gary Stanley is a 43 y.o. male here for spinal stenosis.     Plan:     See problem list for problem-specific plans.

## 2014-03-26 NOTE — Assessment & Plan Note (Signed)
Pt amenable to surgical correction. Was told to find another MD by Dr. Channing Mutters in [place], [place]. Will refer to orthopedics.

## 2014-03-26 NOTE — Patient Instructions (Signed)
I'm sorry you're having so much pain. Tramadol should help take the edge off the pain. Please take these as directed. I have made a referral to orthopedics to evaluate for surgery for spinal stenosis.   Make an appointment for 1 month or come by earlier if you need something else.   - Dr. Jarvis Newcomer

## 2014-03-26 NOTE — Assessment & Plan Note (Addendum)
Again pushing for percocet, not willing to return to pain management clinic. He knows I will not prescribe anything stronger than tramadol. Admits to self-escalating doses without consent. Refilled tramadol 50mg  #90 and will check drug screen with EtOH and opiates. No refills for 1 month.

## 2014-03-27 LAB — DRUG SCREEN URINE W/ALC, NO CONF
AMPHETAMINE SCRN UR: NEGATIVE
BARBITURATE QUANT UR: NEGATIVE
Benzodiazepines.: POSITIVE — AB
COCAINE METABOLITES: POSITIVE — AB
Creatinine,U: 466.5 mg/dL
MARIJUANA METABOLITE: POSITIVE — AB
Methadone: NEGATIVE
OPIATE SCREEN, URINE: NEGATIVE
PHENCYCLIDINE (PCP): NEGATIVE
Propoxyphene: NEGATIVE

## 2014-03-29 LAB — OPIATES/OPIOIDS (LC/MS-MS)
Codeine Urine: NEGATIVE ng/mL (ref <50)
HYDROCODONE: NEGATIVE ng/mL (ref <50)
Hydromorphone: NEGATIVE ng/mL (ref <50)
MORPHINE: NEGATIVE ng/mL (ref <50)
NORHYDROCODONE, UR: NEGATIVE ng/mL (ref <50)
Noroxycodone, Ur: NEGATIVE ng/mL (ref <50)
OXYCODONE, UR: NEGATIVE ng/mL (ref <50)
Oxymorphone: NEGATIVE ng/mL (ref <50)

## 2014-04-01 ENCOUNTER — Telehealth: Payer: Self-pay | Admitting: Family Medicine

## 2014-04-01 NOTE — Telephone Encounter (Signed)
Gary Stanley with [facility]: Pt was seeing a Therapist, sports in [place]. He is not longer going to that dr because he missed an appt. Gary Stanley is trying to get pt in at [facility]. He runs out of his psych meds July 1 Would dr Jarvis Newcomer prescribe those meds if he cannot be seen at behavorial health byt July 1?

## 2014-04-06 ENCOUNTER — Encounter: Payer: Self-pay | Admitting: Clinical

## 2014-04-06 NOTE — Telephone Encounter (Signed)
Please inform her that if he is unable to schedule an appointment by July 1, I am willing to prescribe enough of those medications to avoid withdrawal but will not continue to prescribe these beyond that.

## 2014-04-06 NOTE — Progress Notes (Signed)
CSW received pt goals from [facility], Brent GeneralKeesah Rush LPN.  Personal Goals - I want to get my license back.  STG: Patient will make an appointment in 2 weeks with PCP. LTG: Patient will attend an appointment in 30 days with PCP.

## 2014-04-12 ENCOUNTER — Telehealth: Payer: Self-pay | Admitting: Family Medicine

## 2014-04-12 NOTE — Telephone Encounter (Signed)
Mr. Onnie BoerFreeman's case manager with [facility] called to ask that we send refills to his pharmacy [place] for his Alprazolam and his Diazepam.  His psychiatrist that have been prescribing meds have discharged patient and he is almost out of medication.. Please contact Mr. Neale BurlyFreeman if there are any questions or issues regarding this request.  If you can't reach him, then call his Case Manager to give info

## 2014-04-14 NOTE — Telephone Encounter (Signed)
Hey Dr. Raymondo BandKoval, I meant to ask you about this earlier this morning. I am being asked to prescribe a heavy dose of benzodiazepines which this gentleman was previously receiving for anxiety from a psychiatrist who has recently discharged him from the practice. He has an extensive history of substance abuse and recent UDS was positive for THC. I would like your thoughts on an appropriate tapering strategy.   He is on valium 5mg  QID  AND Xanax 1mg  QID  Thank you,  Hazeline Junkeryan Grunz [phone]

## 2014-04-20 ENCOUNTER — Other Ambulatory Visit: Payer: Self-pay | Admitting: Family Medicine

## 2014-04-20 MED ORDER — DIAZEPAM 5 MG PO TABS: ORAL_TABLET | ORAL | Status: AC

## 2014-04-20 MED ORDER — ALPRAZOLAM 1 MG PO TABS: ORAL_TABLET | ORAL | Status: AC

## 2014-04-20 NOTE — Progress Notes (Signed)
Provided prescriptions for 4-week benzodiazepine taper (valium and xanax) due to pt being dismissed from psychiatry practice. I have documentation of these prescriptions having been written multiple times by that provider, though I have major reservations about ongoing prescriptions for controlled substances with his history. I will not prescribe this any more. He must find a psychiatrist willing to manage his anxiety.   Cocaine + UDS at last visit.   Ryan B. Jarvis NewcomerGrunz, MD, PGY-1 04/20/2014 3:25 PM

## 2014-04-22 ENCOUNTER — Telehealth: Payer: Self-pay | Admitting: Family Medicine

## 2014-04-22 NOTE — Telephone Encounter (Signed)
Mr. Gary Stanley's care manager called regarding the medication he requested from Dr. Angela AdamGrunz(please see msgs from me and Dr. Jarvis NewcomerGrunz on 6/22).  Mr. Gary Stanley's care manager , [name], would like for you to call her and let her know what your decision is on this medication.

## 2014-04-30 ENCOUNTER — Ambulatory Visit: Payer: Self-pay | Admitting: Family Medicine

## 2014-05-11 ENCOUNTER — Telehealth: Payer: Self-pay | Admitting: Family Medicine

## 2014-05-11 NOTE — Telephone Encounter (Signed)
Pt called and has an appointment to see his other doctor on 06/08/14 at 12:30, so he would like a refill on his medication until then. The office number to his doctor is [phone] if you need to verify this. Please send the medication to [facility] in [place]. Please call [name] if you have any questions at [phone]. jw

## 2014-05-12 NOTE — Telephone Encounter (Signed)
Pt is calling again today 7/22 to check the status of his refill request. Can we check on this please. jw

## 2014-05-20 ENCOUNTER — Encounter: Payer: Self-pay | Admitting: Family Medicine

## 2014-07-29 ENCOUNTER — Emergency Department: Payer: Self-pay | Admitting: Emergency Medicine

## 2014-07-29 LAB — URINALYSIS, COMPLETE
BILIRUBIN, UR: NEGATIVE
Bacteria: NONE SEEN
Blood: NEGATIVE
Glucose,UR: NEGATIVE mg/dL (ref 0–75)
Hyaline Cast: 5
Ketone: NEGATIVE
NITRITE: NEGATIVE
PH: 5 (ref 4.5–8.0)
Protein: NEGATIVE
RBC,UR: <1 /HPF (ref 0–5)
Specific Gravity: 1.014 (ref 1.003–1.030)
Squamous Epithelial: NONE SEEN

## 2014-07-29 LAB — ETHANOL: ETHANOL LVL: 124 mg/dL

## 2014-07-29 LAB — COMPREHENSIVE METABOLIC PANEL
ALK PHOS: 98 U/L
Albumin: 3.8 g/dL (ref 3.4–5.0)
Anion Gap: 11 (ref 7–16)
BILIRUBIN TOTAL: 0.5 mg/dL (ref 0.2–1.0)
BUN: 8 mg/dL (ref 7–18)
CHLORIDE: 106 mmol/L (ref 98–107)
CREATININE: 1.15 mg/dL (ref 0.60–1.30)
Calcium, Total: 8.5 mg/dL (ref 8.5–10.1)
Co2: 23 mmol/L (ref 21–32)
Glucose: 101 mg/dL — ABNORMAL HIGH (ref 65–99)
OSMOLALITY: 278 (ref 275–301)
POTASSIUM: 3.9 mmol/L (ref 3.5–5.1)
SGOT(AST): 41 U/L — ABNORMAL HIGH (ref 15–37)
SGPT (ALT): 59 U/L
SODIUM: 140 mmol/L (ref 136–145)
Total Protein: 7.6 g/dL (ref 6.4–8.2)

## 2014-07-29 LAB — DRUG SCREEN, URINE
Amphetamines, Ur Screen: NEGATIVE (ref ?–1000)
BENZODIAZEPINE, UR SCRN: POSITIVE (ref ?–200)
Barbiturates, Ur Screen: NEGATIVE (ref ?–200)
Cannabinoid 50 Ng, Ur ~~LOC~~: NEGATIVE (ref ?–50)
Cocaine Metabolite,Ur ~~LOC~~: POSITIVE (ref ?–300)
MDMA (Ecstasy)Ur Screen: NEGATIVE (ref ?–500)
Methadone, Ur Screen: NEGATIVE (ref ?–300)
Opiate, Ur Screen: NEGATIVE (ref ?–300)
PHENCYCLIDINE (PCP) UR S: NEGATIVE (ref ?–25)
Tricyclic, Ur Screen: NEGATIVE (ref ?–1000)

## 2014-07-29 LAB — CBC
HCT: 50.7 % (ref 40.0–52.0)
HGB: 16.9 g/dL (ref 13.0–18.0)
MCH: 33.2 pg (ref 26.0–34.0)
MCHC: 33.4 g/dL (ref 32.0–36.0)
MCV: 99 fL (ref 80–100)
PLATELETS: 279 10*3/uL (ref 150–440)
RBC: 5.1 10*6/uL (ref 4.40–5.90)
RDW: 13.8 % (ref 11.5–14.5)
WBC: 9 10*3/uL (ref 3.8–10.6)

## 2014-07-29 LAB — ACETAMINOPHEN LEVEL

## 2014-07-29 LAB — SALICYLATE LEVEL

## 2014-08-16 ENCOUNTER — Emergency Department (HOSPITAL_COMMUNITY)
Admission: EM | Admit: 2014-08-16 | Discharge: 2014-08-16 | Disposition: A | Discharge status: Home / Self Care | Payer: Medicaid Other | Attending: Emergency Medicine | Admitting: Emergency Medicine

## 2014-08-16 ENCOUNTER — Encounter (HOSPITAL_COMMUNITY): Payer: Self-pay | Admitting: Emergency Medicine

## 2014-08-16 DIAGNOSIS — F329 Major depressive disorder, single episode, unspecified: Secondary | ICD-10-CM | POA: Insufficient documentation

## 2014-08-16 DIAGNOSIS — F419 Anxiety disorder, unspecified: Secondary | ICD-10-CM | POA: Diagnosis not present

## 2014-08-16 DIAGNOSIS — Z87828 Personal history of other (healed) physical injury and trauma: Secondary | ICD-10-CM | POA: Insufficient documentation

## 2014-08-16 DIAGNOSIS — Z760 Encounter for issue of repeat prescription: Secondary | ICD-10-CM | POA: Diagnosis present

## 2014-08-16 DIAGNOSIS — Z79899 Other long term (current) drug therapy: Secondary | ICD-10-CM | POA: Insufficient documentation

## 2014-08-16 MED ORDER — ALPRAZOLAM 1 MG PO TABS: 1.0000 mg | ORAL_TABLET | Freq: Four times a day (QID) | ORAL | Status: AC

## 2014-08-16 MED ORDER — DIAZEPAM 10 MG PO TABS: 10.0000 mg | ORAL_TABLET | Freq: Four times a day (QID) | ORAL | Status: AC

## 2014-08-16 NOTE — Discharge Instructions (Signed)
Medication Refill, Emergency Department °We have refilled your medication today as a courtesy to you. It is best for your medical care, however, to take care of getting refills done through your primary caregiver's office. They have your records and can do a better job of follow-up than we can in the emergency department. °On maintenance medications, we often only prescribe enough medications to get you by until you are able to see your regular caregiver. This is a more expensive way to refill medications. °In the future, please plan for refills so that you will not have to use the emergency department for this. °Thank you for your help. Your help allows us to better take care of the daily emergencies that enter our department. °Document Released: 01/25/2004 Document Revised: 12/31/2011 Document Reviewed: 01/15/2014 °ExitCare® Patient Information ©2015 [employer]. This information is not intended to replace advice given to you by your health care provider. Make sure you discuss any questions you have with your health care provider. ° °Panic Attacks °Panic attacks are sudden, short-lived surges of severe anxiety, fear, or discomfort. They may occur for no reason when you are relaxed, when you are anxious, or when you are sleeping. Panic attacks may occur for a number of reasons:  °· Healthy people occasionally have panic attacks in extreme, life-threatening situations, such as war or natural disasters. Normal anxiety is a protective mechanism of the body that helps us react to danger (fight or flight response). °· Panic attacks are often seen with anxiety disorders, such as panic disorder, social anxiety disorder, generalized anxiety disorder, and phobias. Anxiety disorders cause excessive or uncontrollable anxiety. They may interfere with your relationships or other life activities. °· Panic attacks are sometimes seen with other mental illnesses, such as depression and posttraumatic stress disorder. °· Certain  medical conditions, prescription medicines, and drugs of abuse can cause panic attacks. °SYMPTOMS  °Panic attacks start suddenly, peak within 20 minutes, and are accompanied by four or more of the following symptoms: °· Pounding heart or fast heart rate (palpitations). °· Sweating. °· Trembling or shaking. °· Shortness of breath or feeling smothered. °· Feeling choked. °· Chest pain or discomfort. °· Nausea or strange feeling in your stomach. °· Dizziness, light-headedness, or feeling like you will faint. °· Chills or hot flushes. °· Numbness or tingling in your lips or hands and feet. °· Feeling that things are not real or feeling that you are not yourself. °· Fear of losing control or going crazy. °· Fear of dying. °Some of these symptoms can mimic serious medical conditions. For example, you may think you are having a heart attack. Although panic attacks can be very scary, they are not life threatening. °DIAGNOSIS  °Panic attacks are diagnosed through an assessment by your health care provider. Your health care provider will ask questions about your symptoms, such as where and when they occurred. Your health care provider will also ask about your medical history and use of alcohol and drugs, including prescription medicines. Your health care provider may order blood tests or other studies to rule out a serious medical condition. Your health care provider may refer you to a mental health professional for further evaluation. °TREATMENT  °· Most healthy people who have one or two panic attacks in an extreme, life-threatening situation will not require treatment. °· The treatment for panic attacks associated with anxiety disorders or other mental illness typically involves counseling with a mental health professional, medicine, or a combination of both. Your health care provider will help   determine what treatment is best for you. °· Panic attacks due to physical illness usually go away with treatment of the illness.  If prescription medicine is causing panic attacks, talk with your health care provider about stopping the medicine, decreasing the dose, or substituting another medicine. °· Panic attacks due to alcohol or drug abuse go away with abstinence. Some adults need professional help in order to stop drinking or using drugs. °HOME CARE INSTRUCTIONS  °· Take all medicines as directed by your health care provider.   °· Schedule and attend follow-up visits as directed by your health care provider. It is important to keep all your appointments. °SEEK MEDICAL CARE IF: °· You are not able to take your medicines as prescribed. °· Your symptoms do not improve or get worse. °SEEK IMMEDIATE MEDICAL CARE IF:  °· You experience panic attack symptoms that are different than your usual symptoms. °· You have serious thoughts about hurting yourself or others. °· You are taking medicine for panic attacks and have a serious side effect. °MAKE SURE YOU: °· Understand these instructions. °· Will watch your condition. °· Will get help right away if you are not doing well or get worse. °Document Released: 10/08/2005 Document Revised: 10/13/2013 Document Reviewed: 05/22/2013 °ExitCare® Patient Information ©2015 [employer]. This information is not intended to replace advice given to you by your health care provider. Make sure you discuss any questions you have with your health care provider. ° °

## 2014-08-16 NOTE — ED Provider Notes (Signed)
This chart was scribed for Layla MawKristen N Ward, DO by Annye AsaAnna Dorsett, ED Scribe. This patient was seen in room APA19/APA19 and the patient's care was started at 12:42 PM.   TIME SEEN: 12:42 PM  CHIEF COMPLAINT: medication management   HPI:   HPI Comments: Gary Stanley is a 43 y.o. male who presents to the Emergency Department for medication management for his anxiety medication. Patient reports that he was scheduled for an appointment with a new care provider with [facility] this afternoon but the physician had to reschedule due to an emergency; his new appointment with [facility] isn't until next week (8 days from now, at 1:45 PM 08/24/14). His states that his case worker suggested that he come to the ED for meds until his next appointment. 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 Denies SI or HI.  Patient reports he was previously receiving his Xanax 1 mg four times a day and Valium 10 mg four times a day by Dr. Guss Bundehalla.  He states that he is no longer seen by this physician because they had a disagreement. He does come with paperwork confirming both these prescriptions and the fact that he was discharged from their practice for a "disagreement".  ROS: See HPI Constitutional: no fever  Eyes: no drainage  ENT: no runny nose   Cardiovascular:  no chest pain  Resp: no SOB  GI: no vomiting GU: no dysuria Integumentary: no rash  Allergy: no hives  Musculoskeletal: no leg swelling  Neurological: no slurred speech ROS otherwise negative  PAST MEDICAL HISTORY/PAST SURGICAL HISTORY:  Past Medical History  Diagnosis Date  . Anxiety   . 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 Alcohol abuse   . Cocaine abuse   . Bipolar affective disorder     MEDICATIONS:  Prior to Admission medications   Medication Sig Start Date End Date Taking? Authorizing Provider  ALPRAZolam Prudy Feeler(XANAX) 1 MG tablet Take 1 tablet  4 times per day for 7 days, then Take 1 tablet 3 times per day for 7 days, then Take 1 tablet 2 times per day for 7 days, then Take 1 tablet 1 times per day for 7 days, then 04/20/14  Yes Tyrone Nineyan B Grunz, MD  diazepam (VALIUM) 5 MG tablet Take 1 tablet 4 times per day for 7 days, then Take 1 tablet 3 times per day for 7 days, then Take 1 tablet 2 times per day for 7 days, then Take 1 tablet 1 times per day for 7 days, then 04/20/14  Yes Tyrone Nineyan B Grunz, MD  gabapentin (NEURONTIN) 100 MG capsule Take 100 mg by mouth 4 (four) times daily.   Yes Historical Provider, MD  traMADol (ULTRAM) 50 MG tablet Take 1 tablet (50 mg total) by mouth 3 (three) times daily as needed. 03/26/14  Yes Tyrone Nineyan B Grunz, MD    ALLERGIES:  Allergies  Allergen Reactions  . Hydrocodone Itching and Nausea Only    SOCIAL HISTORY:  History  Substance Use Topics  . Smoking status: Never Smoker   . Smokeless tobacco: Not on file  . Alcohol Use: 14.4 oz/week    24 Cans of beer per week     Comment: Has been drinking for last month 10/26    FAMILY HISTORY: History reviewed. No pertinent family history.  EXAM: BP 134/79  Pulse 84  Temp(Src) 97.5 F (36.4 C) (Oral)  Resp 18  Ht  5\' 9"  (1.753 m)  Wt 268 lb (121.564 kg)  BMI 39.56 kg/m2  SpO2 100% CONSTITUTIONAL: Alert and oriented and responds appropriately to questions. Well-appearing; well-nourished. Rapid speech, mildly tremulous.  HEAD: Normocephalic EYES: Conjunctivae clear, PERRL ENT: normal nose; no rhinorrhea; moist mucous membranes; pharynx without lesions noted NECK: Supple, no meningismus, no LAD  CARD: RRR; S1 and S2 appreciated; no murmurs, no clicks, no rubs, no gallops RESP: Normal chest excursion without splinting or tachypnea; breath sounds clear and equal bilaterally; no wheezes, no rhonchi, no rales,  ABD/GI: Normal bowel sounds; non-distended; soft, non-tender, no rebound, no guarding BACK:  The back appears normal and is non-tender to palpation,  there is no CVA tenderness EXT: Normal ROM in all joints; non-tender to palpation; no edema; normal capillary refill; no cyanosis    SKIN: Normal color for age and race; warm NEURO: Moves all extremities equally PSYCH: The patient's mood and manner are appropriate. Grooming and personal hygiene are appropriate.no SI or HI  MEDICAL DECISION MAKING: Patient here requesting a medication refill until he can see his primary care physician in eight days. He does appear mildly tremulous and anxious on exam he states he has not had his medications and never we. Given I am concerned he could have benzodiazepine withdrawal, I will give him a prescription to last until he can see his primary care physician.he has paperwork from Dr. Ranelle Oysterhalla's office confirming that he is on Xanax 1 mg four times daily and Valium10 mg four times daily. Have discussed with patient at links that the emergency department is not the appropriate place to have these long-term medications filled and that he will need to have these filled by his PCP in the future. Discussed for term precautions. He verbalize understanding is comfortable with plan.    I personally performed the services described in this documentation, which was scribed in my presence. The recorded information has been reviewed and is accurate.     Layla MawKristen N Ward, DO 08/16/14 [id number]

## 2014-08-16 NOTE — ED Notes (Signed)
Out of meds for 1 week, xanax and valium.  Had an appt today with MD, that canceled the appt due to family emergency.  Pts case worker told him to come here for meds until Tuesday.

## 2014-09-27 ENCOUNTER — Emergency Department (HOSPITAL_COMMUNITY)
Admission: EM | Admit: 2014-09-27 | Discharge: 2014-09-27 | Disposition: A | Discharge status: Home / Self Care | Payer: Medicaid Other | Attending: Emergency Medicine | Admitting: Emergency Medicine

## 2014-09-27 ENCOUNTER — Emergency Department (HOSPITAL_COMMUNITY): Payer: Medicaid Other

## 2014-09-27 ENCOUNTER — Encounter (HOSPITAL_COMMUNITY): Payer: Self-pay | Admitting: Emergency Medicine

## 2014-09-27 DIAGNOSIS — Z87828 Personal history of other (healed) physical injury and trauma: Secondary | ICD-10-CM | POA: Diagnosis not present

## 2014-09-27 DIAGNOSIS — Y9289 Other specified places as the place of occurrence of the external cause: Secondary | ICD-10-CM | POA: Diagnosis not present

## 2014-09-27 DIAGNOSIS — S9305XA Dislocation of left ankle joint, initial encounter: Secondary | ICD-10-CM

## 2014-09-27 DIAGNOSIS — Y998 Other external cause status: Secondary | ICD-10-CM | POA: Insufficient documentation

## 2014-09-27 DIAGNOSIS — S0990XA Unspecified injury of head, initial encounter: Secondary | ICD-10-CM | POA: Diagnosis not present

## 2014-09-27 DIAGNOSIS — S82832A Other fracture of upper and lower end of left fibula, initial encounter for closed fracture: Secondary | ICD-10-CM | POA: Diagnosis not present

## 2014-09-27 DIAGNOSIS — W208XXA Other cause of strike by thrown, projected or falling object, initial encounter: Secondary | ICD-10-CM | POA: Diagnosis not present

## 2014-09-27 DIAGNOSIS — F419 Anxiety disorder, unspecified: Secondary | ICD-10-CM | POA: Diagnosis not present

## 2014-09-27 DIAGNOSIS — S4992XA Unspecified injury of left shoulder and upper arm, initial encounter: Secondary | ICD-10-CM | POA: Diagnosis not present

## 2014-09-27 DIAGNOSIS — R52 Pain, unspecified: Secondary | ICD-10-CM

## 2014-09-27 DIAGNOSIS — S99912A Unspecified injury of left ankle, initial encounter: Secondary | ICD-10-CM | POA: Diagnosis present

## 2014-09-27 DIAGNOSIS — Y9389 Activity, other specified: Secondary | ICD-10-CM | POA: Diagnosis not present

## 2014-09-27 DIAGNOSIS — S82402A Unspecified fracture of shaft of left fibula, initial encounter for closed fracture: Secondary | ICD-10-CM

## 2014-09-27 LAB — CBC WITH DIFFERENTIAL/PLATELET
BASOS PCT: 0 % (ref 0–1)
Basophils Absolute: 0 10*3/uL (ref 0.0–0.1)
EOS ABS: 0 10*3/uL (ref 0.0–0.7)
Eosinophils Relative: 0 % (ref 0–5)
HCT: 46.2 % (ref 39.0–52.0)
Hemoglobin: 16.2 g/dL (ref 13.0–17.0)
LYMPHS ABS: 1.3 10*3/uL (ref 0.7–4.0)
Lymphocytes Relative: 8 % — ABNORMAL LOW (ref 12–46)
MCH: 33.1 pg (ref 26.0–34.0)
MCHC: 35.1 g/dL (ref 30.0–36.0)
MCV: 94.5 fL (ref 78.0–100.0)
Monocytes Absolute: 0.9 10*3/uL (ref 0.1–1.0)
Monocytes Relative: 6 % (ref 3–12)
NEUTROS PCT: 86 % — AB (ref 43–77)
Neutro Abs: 13.3 10*3/uL — ABNORMAL HIGH (ref 1.7–7.7)
PLATELETS: 252 10*3/uL (ref 150–400)
RBC: 4.89 MIL/uL (ref 4.22–5.81)
RDW: 12.4 % (ref 11.5–15.5)
WBC: 15.5 10*3/uL — ABNORMAL HIGH (ref 4.0–10.5)

## 2014-09-27 LAB — COMPREHENSIVE METABOLIC PANEL
ALBUMIN: 4.1 g/dL (ref 3.5–5.2)
ALK PHOS: 80 U/L (ref 39–117)
ALT: 34 U/L (ref 0–53)
ANION GAP: 14 (ref 5–15)
AST: 24 U/L (ref 0–37)
BUN: 12 mg/dL (ref 6–23)
CALCIUM: 9.3 mg/dL (ref 8.4–10.5)
CO2: 23 mEq/L (ref 19–32)
Chloride: 100 mEq/L (ref 96–112)
Creatinine, Ser: 0.98 mg/dL (ref 0.50–1.35)
GFR calc non Af Amer: >90 mL/min (ref >90)
Glucose, Bld: 106 mg/dL — ABNORMAL HIGH (ref 70–99)
POTASSIUM: 4.3 meq/L (ref 3.7–5.3)
SODIUM: 137 meq/L (ref 137–147)
TOTAL PROTEIN: 7.5 g/dL (ref 6.0–8.3)
Total Bilirubin: 0.6 mg/dL (ref 0.3–1.2)

## 2014-09-27 LAB — URINALYSIS, ROUTINE W REFLEX MICROSCOPIC
Bilirubin Urine: NEGATIVE
Glucose, UA: NEGATIVE mg/dL
HGB URINE DIPSTICK: NEGATIVE
Ketones, ur: 15 mg/dL — AB
Leukocytes, UA: NEGATIVE
NITRITE: NEGATIVE
Protein, ur: NEGATIVE mg/dL
Specific Gravity, Urine: 1.023 (ref 1.005–1.030)
UROBILINOGEN UA: 0.2 mg/dL (ref 0.0–1.0)
pH: 6.5 (ref 5.0–8.0)

## 2014-09-27 MED ORDER — FENTANYL CITRATE 0.05 MG/ML IJ SOLN: 50.0000 ug | Freq: Once | INTRAMUSCULAR | Status: DC

## 2014-09-27 MED ORDER — HYDROMORPHONE HCL 1 MG/ML IJ SOLN
1.0000 mg | Freq: Once | INTRAMUSCULAR | Status: AC
  Administered 2014-09-27: 1 mg via INTRAVENOUS
  Filled 2014-09-27: qty 1

## 2014-09-27 MED ORDER — OXYCODONE-ACETAMINOPHEN 5-325 MG PO TABS
2.0000 | ORAL_TABLET | Freq: Once | ORAL | Status: AC
  Administered 2014-09-27: 2 via ORAL
  Filled 2014-09-27: qty 2

## 2014-09-27 MED ORDER — OXYCODONE-ACETAMINOPHEN 5-325 MG PO TABS: 2.0000 | ORAL_TABLET | ORAL | Status: AC | PRN

## 2014-09-27 NOTE — ED Notes (Signed)
Patient transported to CT 

## 2014-09-27 NOTE — ED Provider Notes (Signed)
CSN: [id number]     Arrival date & time 09/27/14  1609 History   First MD Initiated Contact with Patient 09/27/14 1620     Chief Complaint  Patient presents with  . Ankle Injury     (Consider location/radiation/quality/duration/timing/severity/associated sxs/prior Treatment) HPI  43 year old male without significant past medical history presents to the emergency department after an accident involving a tree that hit him in the head after which he lost consciousness. Friend that was helping them cut trees said that his foot got caught between 2 limbs when he fell down. Patient with obvious deformity arrived via EMS. Acting normally now. Has amnesia to the event. Has left shoulder pain left ankle pain and a headache. No nausea vomiting or other complaints at this time.  Past Medical History  Diagnosis Date  . Anxiety   . 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 Alcohol abuse   . Cocaine abuse   . Bipolar affective disorder    Past Surgical History  Procedure Laterality Date  . Craniectomy      pt had surgery to remove blood clot from brain    History reviewed. No pertinent family history. History  Substance Use Topics  . Smoking status: Never Smoker   . Smokeless tobacco: Not on file  . Alcohol Use: 14.4 oz/week    24 Cans of beer per week     Comment: Has been drinking for last month 10/26    Review of Systems  Constitutional: Negative for fever and diaphoresis.  Eyes: Negative for photophobia and pain.  Respiratory: Negative for cough and wheezing.   Cardiovascular: Negative for chest pain and palpitations.  Gastrointestinal: Negative for nausea and vomiting.  Endocrine: Negative for polydipsia and polyuria.  Musculoskeletal:       Left ankle pain. Left shoulder pain  Neurological: Positive for headaches.  All other systems reviewed and are negative.     Allergies  Hydrocodone  Home Medications   Prior to Admission medications   Medication Sig Start Date End Date Taking?  Authorizing Provider  ALPRAZolam (XANAX) 1 MG tablet Take 1 tablet 4 times per day for 7 days, then Take 1 tablet 3 times per day for 7 days, then Take 1 tablet 2 times per day for 7 days, then Take 1 tablet 1 times per day for 7 days, then Patient taking differently: Take 1 mg by mouth 2 (two) times daily as needed. Take 1 tablet 4 times per day for 7 days, then Take 1 tablet 3 times per day for 7 days, then Take 1 tablet 2 times per day for 7 days, then Take 1 tablet 1 times per day for 7 days, then 04/20/14  Yes Tyrone Nine, MD  ALPRAZolam Prudy Feeler) 1 MG tablet Take 1 tablet (1 mg total) by mouth 4 (four) times daily. Patient not taking: Reported on 09/27/2014 08/16/14   Kristen N Ward, DO  diazepam (VALIUM) 10 MG tablet Take 1 tablet (10 mg total) by mouth 4 (four) times daily. Patient not taking: Reported on 09/27/2014 08/16/14   Kristen N Ward, DO  diazepam (VALIUM) 5 MG tablet Take 1 tablet 4 times per day for 7 days, then Take 1 tablet 3 times per day for 7 days, then Take 1 tablet 2 times per day for 7 days, then Take 1 tablet 1 times per day for 7 days, then Patient not taking: Reported on 09/27/2014 04/20/14   Tyrone Nine, MD  oxyCODONE-acetaminophen (PERCOCET/ROXICET) 5-325 MG per tablet Take 2 tablets by  mouth every 4 (four) hours as needed for severe pain. 09/27/14   Marily MemosJason Bedelia Pong, MD  traMADol (ULTRAM) 50 MG tablet Take 1 tablet (50 mg total) by mouth 3 (three) times daily as needed. Patient not taking: Reported on 09/27/2014 03/26/14   Tyrone Nineyan B Grunz, MD   BP 130/76 mmHg  Pulse 80  Temp(Src) 98.5 F (36.9 C) (Oral)  Resp 18  Ht 5\' 11"  (1.803 m)  Wt 270 lb (122.471 kg)  BMI 37.67 kg/m2  SpO2 99% Physical Exam  Constitutional: He is oriented to person, place, and time. He appears well-developed and well-nourished.  HENT:  Head: Normocephalic and atraumatic.  Eyes: Conjunctivae and EOM are normal.  Neck: Normal range of motion. Neck supple.  Cardiovascular: Normal rate and  regular rhythm.   Pulmonary/Chest: Effort normal. No respiratory distress.  Abdominal: Soft. There is no tenderness.  Musculoskeletal: Normal range of motion. He exhibits no edema or tenderness.  Left ankle with crepitus and mild deformity second swelling.  Neurological: He is alert and oriented to person, place, and time.  Skin: Skin is warm and dry.  Nursing note and vitals reviewed.   ED Course  Procedures (including critical care time) Labs Review Labs Reviewed  CBC WITH DIFFERENTIAL - Abnormal; Notable for the following:    WBC 15.5 (*)    Neutrophils Relative % 86 (*)    Neutro Abs 13.3 (*)    Lymphocytes Relative 8 (*)    All other components within normal limits  COMPREHENSIVE METABOLIC PANEL - Abnormal; Notable for the following:    Glucose, Bld 106 (*)    All other components within normal limits  URINALYSIS, ROUTINE W REFLEX MICROSCOPIC - Abnormal; Notable for the following:    Ketones, ur 15 (*)    All other components within normal limits    Imaging Review Dg Tibia/fibula Left  09/27/2014   CLINICAL DATA:  Injury to with tree falling on leg  EXAM: LEFT TIBIA AND FIBULA - 2 VIEW  COMPARISON:  None.  FINDINGS: There is an oblique fracture through the distal left fibular metaphysis with adjacent soft tissue swelling. Additionally the talus is laterally displaced with respect to the distal tibia consistent with a fracture dislocation. A small fracture fragment is noted medially consistent with an avulsion from the medial malleolus.  IMPRESSION: Fracture dislocation of the left ankle as described.   Electronically Signed   By: Alcide CleverMark  Lukens M.D.   On: 09/27/2014 18:35   Ct Head Wo Contrast  09/27/2014   CLINICAL DATA:  Was cutting a maple tree, tree fell on patient, loss of consciousness, initial encounter  EXAM: CT HEAD WITHOUT CONTRAST  CT CERVICAL SPINE WITHOUT CONTRAST  TECHNIQUE: Multidetector CT imaging of the head and cervical spine was performed following the standard  protocol without intravenous contrast. Multiplanar CT image reconstructions of the cervical spine were also generated.  COMPARISON:  CT head 04/14/2011, CT cervical spine 02/11/2009  FINDINGS: CT HEAD FINDINGS  Normal ventricular morphology.  No midline shift or mass effect.  Normal appearance of brain parenchyma.  No intracranial hemorrhage, mass lesion or evidence acute infarction.  No extra-axial fluid collections.  RIGHT parietal scalp soft tissue swelling/hematoma.  Bones demineralized without acute osseous findings.  Evidence of remote RIGHT temporoparietal craniotomy.  CT CERVICAL SPINE FINDINGS  Osseous demineralization.  Prevertebral soft tissues normal thickness.  Beam hardening artifacts from the shoulders.  Vertebral body and disc space heights maintained.  Visualized skullbase intact.  No acute fracture, subluxation or bone  destruction.  Tips of lung apices clear.  IMPRESSION: No acute intracranial abnormalities.  Evidence of remote RIGHT temporoparietal craniotomy.  No acute cervical spine abnormalities.   Electronically Signed   By: Ulyses SouthwardMark  Boles M.D.   On: 09/27/2014 19:02   Ct Cervical Spine Wo Contrast  09/27/2014   CLINICAL DATA:  Was cutting a maple tree, tree fell on patient, loss of consciousness, initial encounter  EXAM: CT HEAD WITHOUT CONTRAST  CT CERVICAL SPINE WITHOUT CONTRAST  TECHNIQUE: Multidetector CT imaging of the head and cervical spine was performed following the standard protocol without intravenous contrast. Multiplanar CT image reconstructions of the cervical spine were also generated.  COMPARISON:  CT head 04/14/2011, CT cervical spine 02/11/2009  FINDINGS: CT HEAD FINDINGS  Normal ventricular morphology.  No midline shift or mass effect.  Normal appearance of brain parenchyma.  No intracranial hemorrhage, mass lesion or evidence acute infarction.  No extra-axial fluid collections.  RIGHT parietal scalp soft tissue swelling/hematoma.  Bones demineralized without acute osseous  findings.  Evidence of remote RIGHT temporoparietal craniotomy.  CT CERVICAL SPINE FINDINGS  Osseous demineralization.  Prevertebral soft tissues normal thickness.  Beam hardening artifacts from the shoulders.  Vertebral body and disc space heights maintained.  Visualized skullbase intact.  No acute fracture, subluxation or bone destruction.  Tips of lung apices clear.  IMPRESSION: No acute intracranial abnormalities.  Evidence of remote RIGHT temporoparietal craniotomy.  No acute cervical spine abnormalities.   Electronically Signed   By: Ulyses SouthwardMark  Boles M.D.   On: 09/27/2014 19:02   Dg Shoulder Left  09/27/2014   CLINICAL DATA:  Fall, trauma, injury, ankle fracture dislocation  EXAM: LEFT SHOULDER - 2+ VIEW  COMPARISON:  09/27/2014, 04/10/2013  FINDINGS: Slight worsening degenerative changes of the inferior glenoid surface in the adjacent humeral head. No malalignment or definite displaced fracture. AC joint aligned.  IMPRESSION: Progressive glenohumeral degenerative change. No definite acute osseous finding.   Electronically Signed   By: Ruel Favorsrevor  Shick M.D.   On: 09/27/2014 18:36   Dg Knee Complete 4 Views Left  09/27/2014   CLINICAL DATA:  Per EMS, pt was cutting a large maple tree, tree came down on pt and LOC was noted. Pt doesn't know how he fx his ankle. Pain all over anatomy being imaged.  EXAM: LEFT KNEE - COMPLETE 4+ VIEW  COMPARISON:  None.  FINDINGS: There is no evidence of fracture, dislocation, or joint effusion. There is no evidence of arthropathy or other focal bone abnormality. Soft tissues are unremarkable.  IMPRESSION: Negative.   Electronically Signed   By: Amie Portlandavid  Ormond M.D.   On: 09/27/2014 18:32   Dg Foot Complete Left  09/27/2014   CLINICAL DATA:  Ankle fracture after tree fell on patient  EXAM: LEFT FOOT - COMPLETE 3+ VIEW  COMPARISON:  None.  FINDINGS: There is a fracture deformity involving the distal fibula. There is posterior displacement of the distal fracture fragments. Widening  of the ankle mortise is identified with suspected disruption of the medial collateral ligaments.  IMPRESSION: 1. Distal fibular fracture.   Electronically Signed   By: Signa Kellaylor  Stroud M.D.   On: 09/27/2014 18:35     EKG Interpretation   Date/Time:  Monday September 27 2014 16:19:23 EST Ventricular Rate:  73 PR Interval:  196 QRS Duration: 106 QT Interval:  384 QTC Calculation: 423 R Axis:   80 Text Interpretation:  Sinus rhythm Borderline T abnormalities, anterior  leads Artifact When compared with ECG of 01/15/2014, No significant  change  was found Confirmed by [facility]  MD, DAVID ([id number]) on 09/27/2014 5:23:35 PM      MDM   Final diagnoses:  Pain  Fibula fracture, left, closed, initial encounter  Ankle dislocation, left, initial encounter    43 year old male that presents to the emergency department after an accident involving. I can normally with a normal neurologic exam. Head CT C-spine CT are both negative. Also with pain in his shoulder which is negative on x-ray for any fractures no range of motion abnormalities doubt serious rotator cuff injury. So with pain in his left ankle which revealed a fibula fracture, fixation of his talus on his tibia. This was splinted and discussed with orthopedics who recommended urgent follow-up in the office tomorrow. Patient's pain controlled crutches given splinted. He is neurovascularly intact after splinting as evidenced by good sensation motor and good cap refill.    Marily Memos, MD 09/27/14 2129  Dione Booze, MD 09/27/14 2322

## 2014-09-27 NOTE — ED Notes (Signed)
Pt aware of urine sample needed. Pt unable to go at the moment.

## 2014-09-27 NOTE — ED Notes (Signed)
Per EMS, pt was cutting a large maple tree, tree came down on pt and LOC was noted. Pt doesn't know how he fx his ankle. Pt arrived A&Ox4, NAD noted. VSS. 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 20G IV placed in left AC, of Fentanyl given.

## 2014-09-27 NOTE — ED Provider Notes (Signed)
43 year old male was knocked out by a tree that fell on him as he was cutting it down for firewood. It hit him on the right side of his head and he is also complaining of pain in his left shoulder. He noted marked instability of his left ankle in that when he tried to lift it up, the foot just fell off to the side. He denies neck, back, chest, abdomen injury. On exam, there is hematoma present in the right parietal area. There is some erythema noted of the posterior aspect of the left shoulder with tenderness present but no crepitus. He is in a stiff cervical collar but neck is nontender. There is marked swelling around the left ankle which is grossly unstable but distal neurovascular exam is intact with strong pulses, prompt capillary refill, normal sensation. [facility] and cervical spine.  X-rays show the only injury is to his right ankle with distal fibular fracture and open mortise with disruption of the tibiotalar ligament. He was placed in a splint and is referred to orthopedics for follow-up.  Procedure: Splint application Location: Right lower leg Indication: Fracture dislocation of right ankle Material used: Cast padding, fiberglass splint arrival, elastic bandage Type of splint: Posterior and stirrup Prior to procedure, a timeout was held. Patient was identified verbally and with hospital supplied armband Patient tolerated procedure well without complications. Evaluation following procedure showed intact neurovascular status  I saw and evaluated the patient, reviewed the resident's note and I agree with the findings and plan.   EKG Interpretation   Date/Time:  Monday September 27 2014 16:19:23 EST Ventricular Rate:  73 PR Interval:  196 QRS Duration: 106 QT Interval:  384 QTC Calculation: 423 R Axis:   80 Text Interpretation:  Sinus rhythm Borderline T abnormalities, anterior  leads Artifact When compared with ECG of 01/15/2014, No  significant change  was found [facility] by [facility]  MD, Tobin Cadiente ([id number]) on 09/27/2014 5:23:35 PM         Dione Boozeavid Zackory Pudlo, MD 09/28/14 [phone]

## 2014-09-27 NOTE — Progress Notes (Signed)
Orthopedic Tech Progress Note Patient Details:  Gary GarbeJudson J Stanley 1971/05/11 [id number]  Ortho Devices Type of Ortho Device: Ace wrap, Crutches, Stirrup splint, Post (short leg) splint Ortho Device/Splint Location: LLE Ortho Device/Splint Interventions: Ordered, Application   Jennye MoccasinHughes, Erica Osuna Craig 09/27/2014, 8:41 PM

## 2014-09-27 NOTE — ED Notes (Signed)
EDP at bedside  

## 2014-09-27 NOTE — ED Notes (Signed)
Ortho tech paged  

## 2014-09-29 ENCOUNTER — Ambulatory Visit: Payer: Self-pay | Admitting: Orthopedic Surgery

## 2014-10-01 ENCOUNTER — Ambulatory Visit: Payer: Self-pay | Admitting: Orthopedic Surgery
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
## 2014-10-11 ENCOUNTER — Ambulatory Visit (HOSPITAL_COMMUNITY): Admission: RE | Admit: 2014-10-11 | Payer: Medicaid Other | Source: Ambulatory Visit | Admitting: Specialist

## 2014-10-11 ENCOUNTER — Encounter (HOSPITAL_COMMUNITY): Admission: RE | Payer: Self-pay | Source: Ambulatory Visit

## 2014-10-11 SURGERY — OPEN REDUCTION INTERNAL FIXATION (ORIF) ANKLE FRACTURE
Anesthesia: General | Site: Ankle | Laterality: Left

## 2014-10-22 DEATH — deceased
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
## 2015-02-11 NOTE — Consult Note (Signed)
PATIENT NAME:  Gary Stanley, [place] MR#:  [id number] DATE OF BIRTH:  December 21, 1970  DATE OF CONSULTATION:  09/24/2013  REFERRING PHYSICIAN:  Chiquita LothJade Sung, MD CONSULTING PHYSICIAN:  Ardeen FillersUzma S. Garnetta BuddyFaheem, MD  REASON FOR CONSULTATION: I got into a fight with my brother.  HISTORY OF PRESENT ILLNESS: The patient is a 44 year old single white male who presented to the ER as IVC was taken out by the family. He reported that he was drinking too much alcohol over one-fifth per day. He reported that he was consuming approximately for the past 6 to 7 days. He reported that all this started to build up in his system. He stated that he has no drive in his life. He was feeling depressed, hopeless, and helpless at this time. The patient stated that he got into an argument with his father and they were talking about guns as his father is a Acupuncturistgun collector. However, the patient did not have any thoughts to hurt himself. He reported that he was also getting medications including Xanax and Valium from his primary psychiatrist, Dr. 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 The patient reported that he came to the hospital as he wanted to control his withdrawal symptoms. The patient currently lives with his parents, which are both retired, and he is on disability. He reported that he is not having any withdrawal symptoms, but he is willing to go to the detox program to control the shakes and tremors. He was repeatedly asking for the fluids which was given by the CNA. He denied having any hallucinations. He reported that he has nerve damage on the right side and that causes a lot of pain and he gets Xanax and Valium from Dr. Guss Bundehalla.  PAST PSYCHIATRIC HISTORY: The patient reported that he has history of detox in the past and went [facility] as well as to [facility] in the past. His longest sobriety was for 1 month. He reported that he has never been admitted to any other psychiatric  facility. He denied any history of suicide attempts in the past. He reported that his current psychotropic medications includes Xanax 1 mg 3 times a day and Valium 10 mg 4 times a day, prescribed by Dr. Guss [facility], [facility] [place]. He has been following with her for many years. The patient reported that he has never been tried on any other psychotropic medications.   SUBSTANCE ABUSE HISTORY: The patient reported that he started drinking at the age of 44. He has no problems with using cocaine and marijuana, but he recently used some cocaine. He reported that his drug of choice is alcohol. He denied any history of withdrawal seizures.   ALLERGIES: No known drug allergies.   FAMILY HISTORY: The patient denied that his family members are alcoholics.   CURRENT HOME MEDICATIONS: Xanax 2 mg 3 times a day, Valium 5 mg 4 times a day, Percocet 5/325 mg every 6 hours.   MEDICAL HISTORY: Anxiety and brain surgery after having a clot in the brain.   ALLERGIES: HYDROCODONE.   VITAL SIGNS: Temperature 97.6, pulse 109, respirations 20, blood pressure 126/85.  LABORATORY DATA: Glucose 122, BUN 17, creatinine 1.3, sodium 137, potassium 3.7, chloride 104, bicarbonate 23, anion gap 10, osmolality 277, calcium 8.4. Blood alcohol level 249 at the time of admission. Protein 7.9, albumin 3.6, bilirubin 0.3, alkaline phosphatase 135, AST 58, ALT 84. TSH 3.7. UA is positive for benzodiazepine. WBC  10, RBC 5.12, hemoglobin 17, hematocrit 48.9, platelet count 283, MCV 96, MCH 33.2, RDW 14.6.   REVIEW OF SYSTEMS: CONSTITUTIONAL: Denies any fever or chills. No weight changes.  EYES: No double or blurred vision.  RESPIRATORY: No shortness of breath or cough.  CARDIOVASCULAR: No chest pain or orthopnea.  GASTROINTESTINAL: No abdominal pain, nausea, vomiting, or diarrhea.  GENITOURINARY: No incontinence or frequency.  ENDOCRINE: No heat or cold intolerance.  LYMPHATIC: No anemia or easy bruising.   INTEGUMENTARY: No acne or rash.  MUSCULOSKELETAL: No muscle or joint pain.  NEUROLOGIC: No tingling or weakness.   MENTAL STATUS EXAMINATION: The patient is a moderately built male who was lying in the bed. He maintained fair eye contact. His speech was low in tone and volume. Mood was depressed and anxious. Affect was congruent. Thought process was logical, goal-directed. Thought content was nondelusional. He demonstrated fair insight and judgment regarding his use of alcohol. He denied having any thoughts to harm himself.   DIAGNOSTIC IMPRESSION: AXIS I: 1.  Alcohol dependence.  2.  Alcohol withdrawal.  AXIS II: None.  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   TREATMENT PLAN:  1.  The patient will be released from involuntary commitment at this time as he does not meet the criteria for inferior vena cava. 2.  He will be started on Librium 50 mg p.o. q. 6 hours for alcohol withdrawal and also for withdrawal from the benzodiazepines as he was getting high doses of the same.  3.  He will be referred to [facility] for detox and for further treatment. He agreed with the plan.   The patient will be discharged to [facility] once he becomes clinically stable. Thank you for allowing me to participate in the care of this patient.   ____________________________ Ardeen Fillers. Garnetta Buddy, MD usf:sb D: 09/24/2013 15:41:40 ET T: 09/24/2013 16:33:00 ET JOB#: [id number]  cc: Ardeen Fillers. Garnetta Buddy, MD, <Dictator> Rhunette Croft MD ELECTRONICALLY SIGNED 10/06/2013 13:52
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
## 2015-10-24 IMAGING — DX DG FOOT COMPLETE 3+V*L*
3 series · 3 of 3 positions shown · non-contrast
Comparison: None.

CLINICAL DATA: Ankle fracture after tree fell on patient

EXAM:
LEFT FOOT - COMPLETE 3+ VIEW

[foot ap]
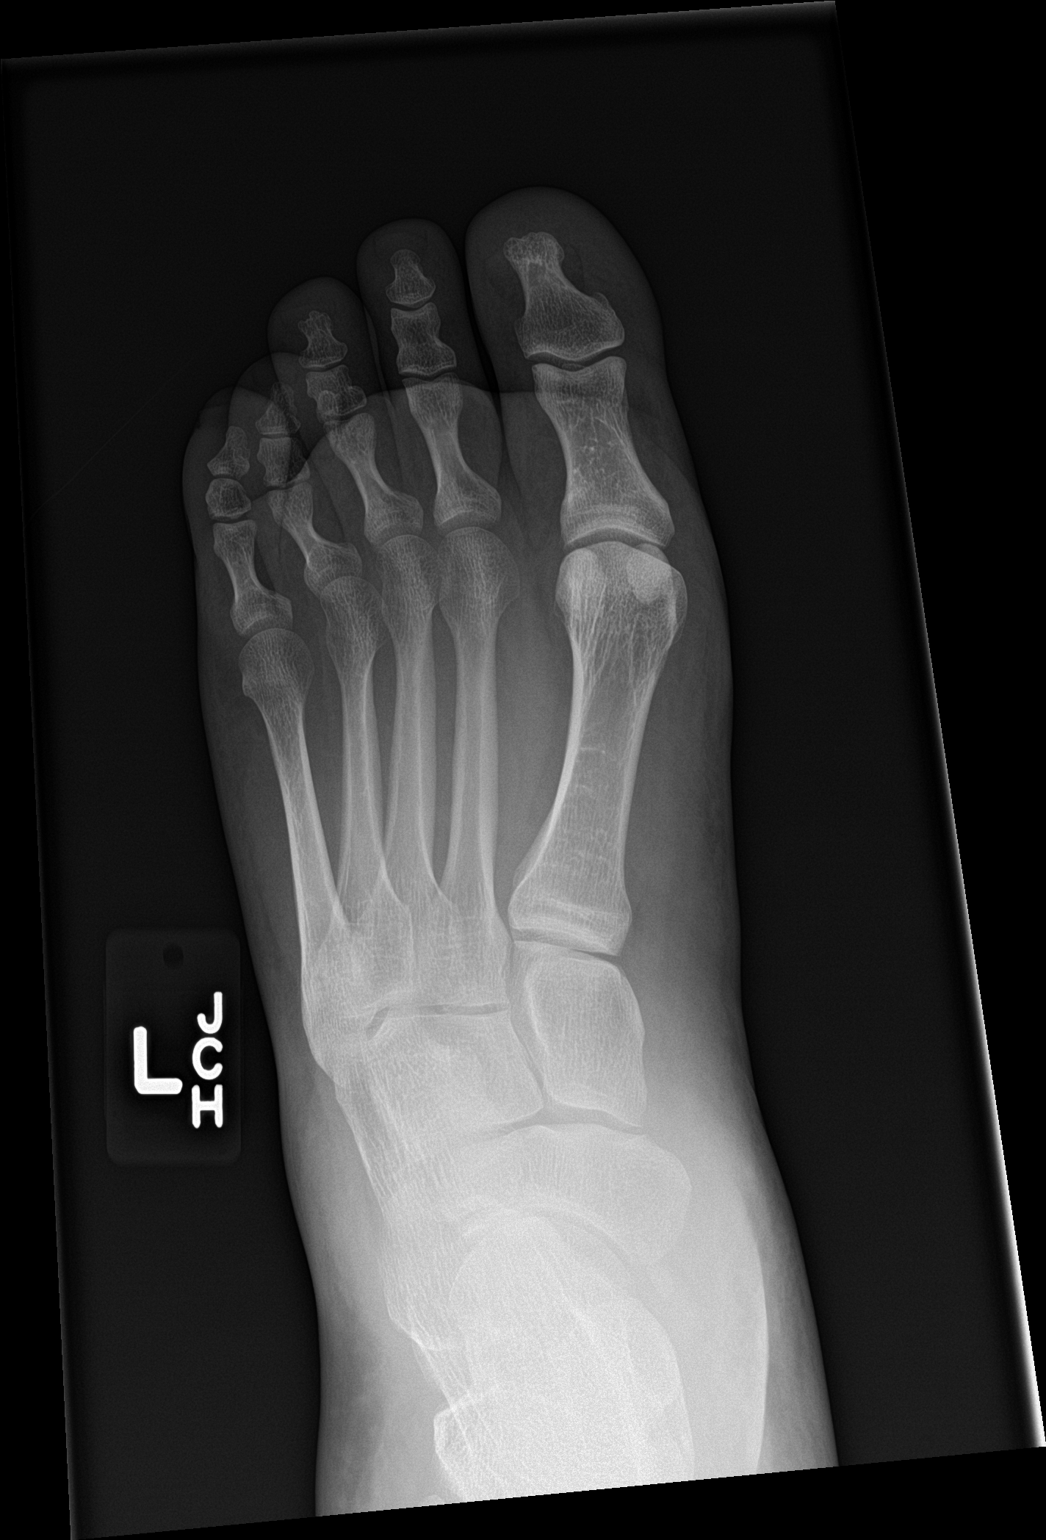

[foot obl]
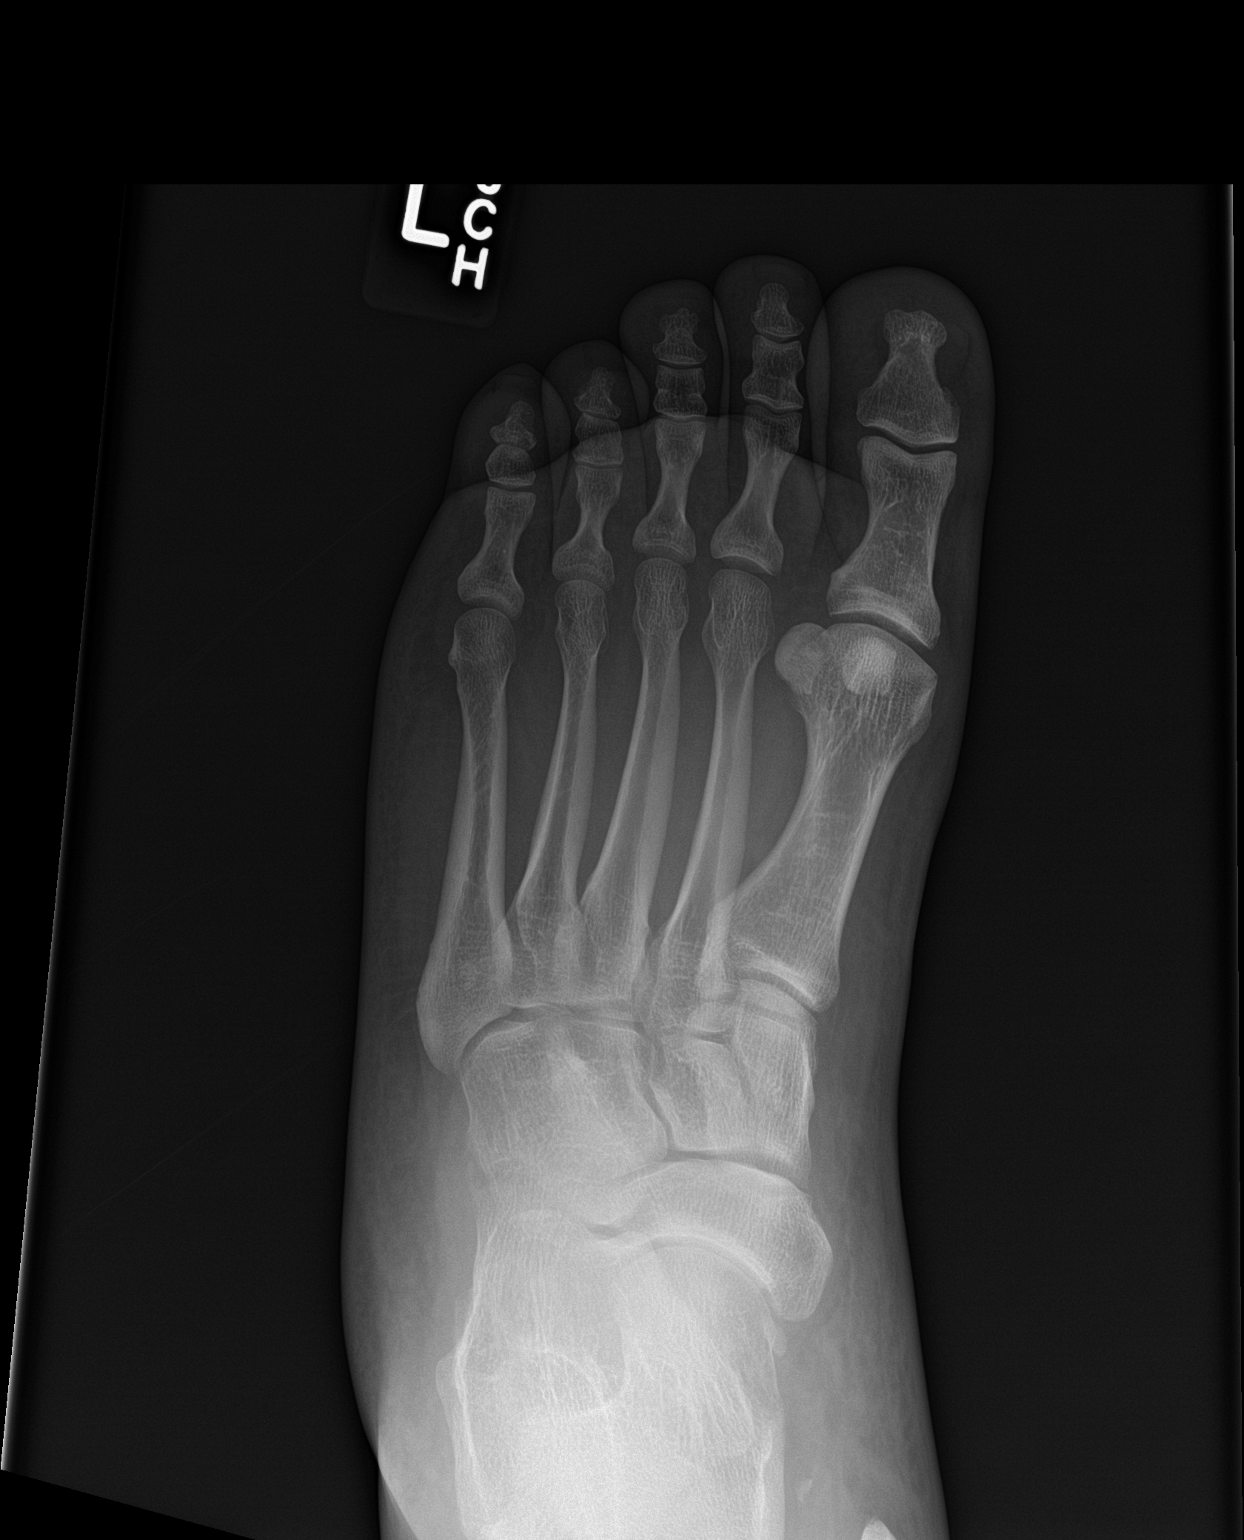

[foot lat]
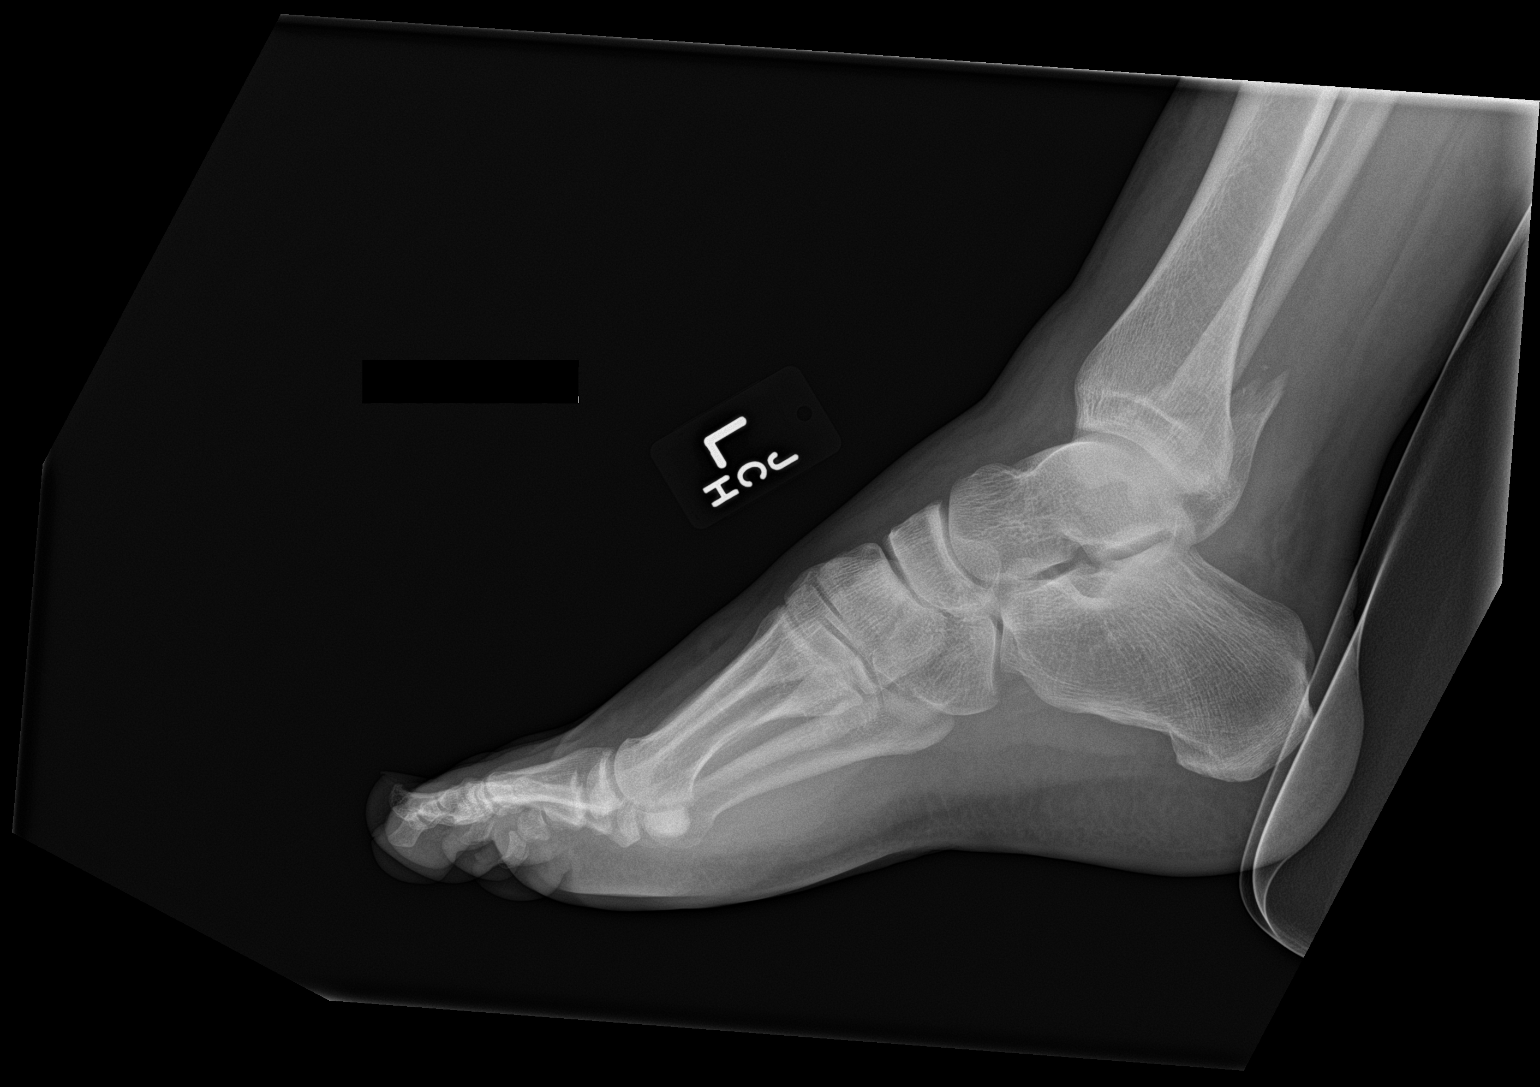

[3 of 3 positions shown; findings below may reference images not displayed]

FINDINGS: There is a fracture deformity involving the distal fibula. There is
posterior displacement of the distal fracture fragments. Widening of
the ankle mortise is identified with suspected disruption of the
medial collateral ligaments.
IMPRESSION: 1. Distal fibular fracture.

## 2015-10-24 IMAGING — DX DG TIBIA/FIBULA 2V*L*
4 series · 4 of 4 positions shown · non-contrast
Comparison: None.

CLINICAL DATA: Injury to with tree falling on leg

EXAM:
LEFT TIBIA AND FIBULA - 2 VIEW

[tibia ap (1 of 2)]
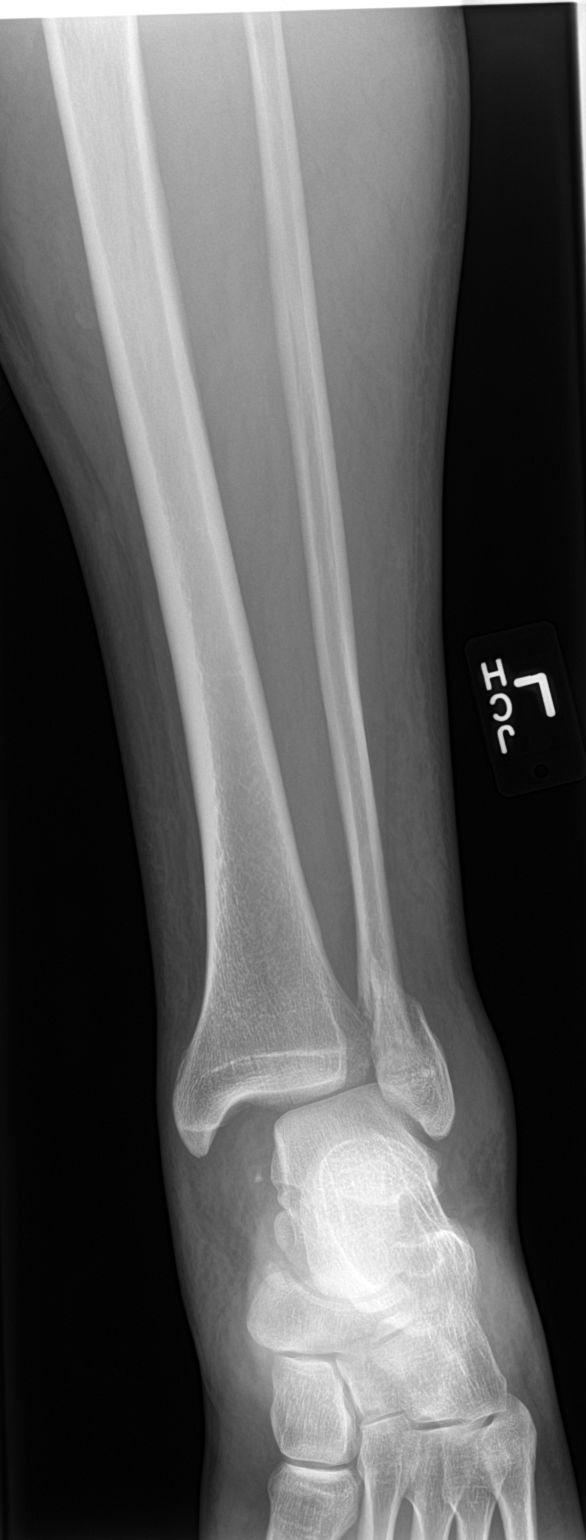

[tibia ap (2 of 2)]
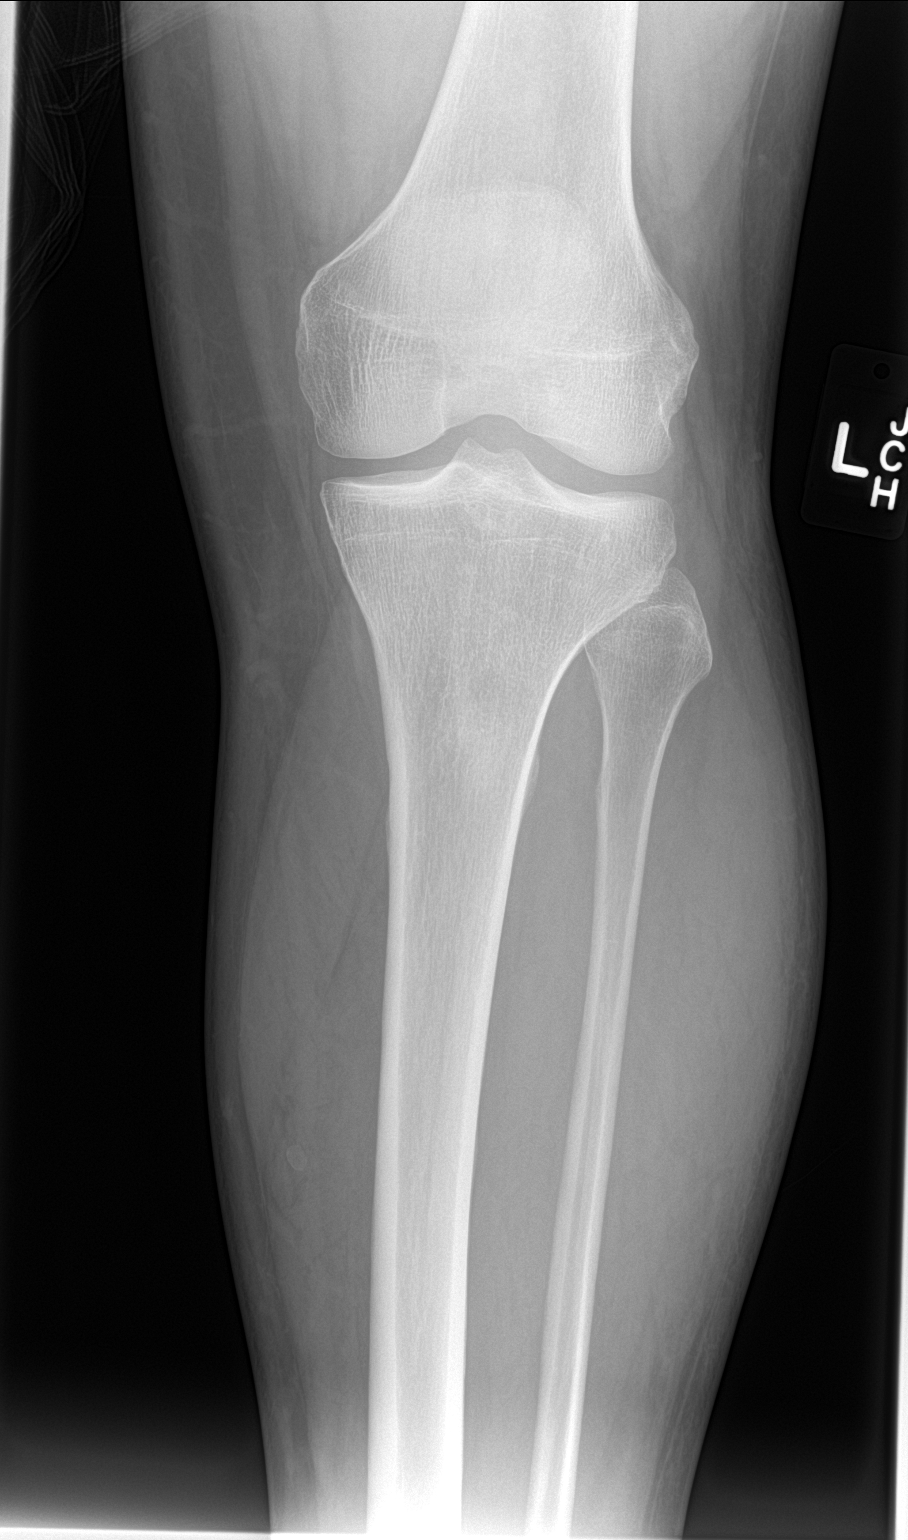

[tibia lat (1 of 2)]
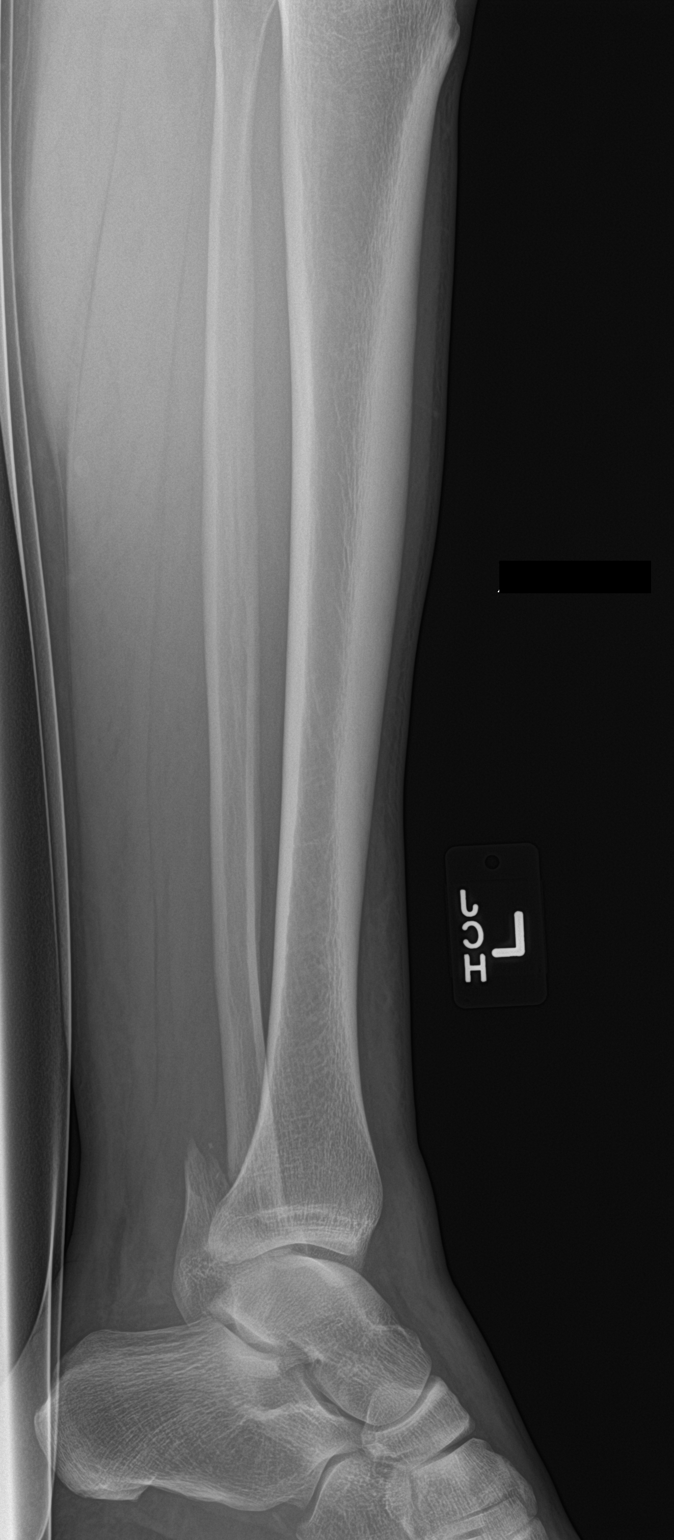

[tibia lat (2 of 2)]
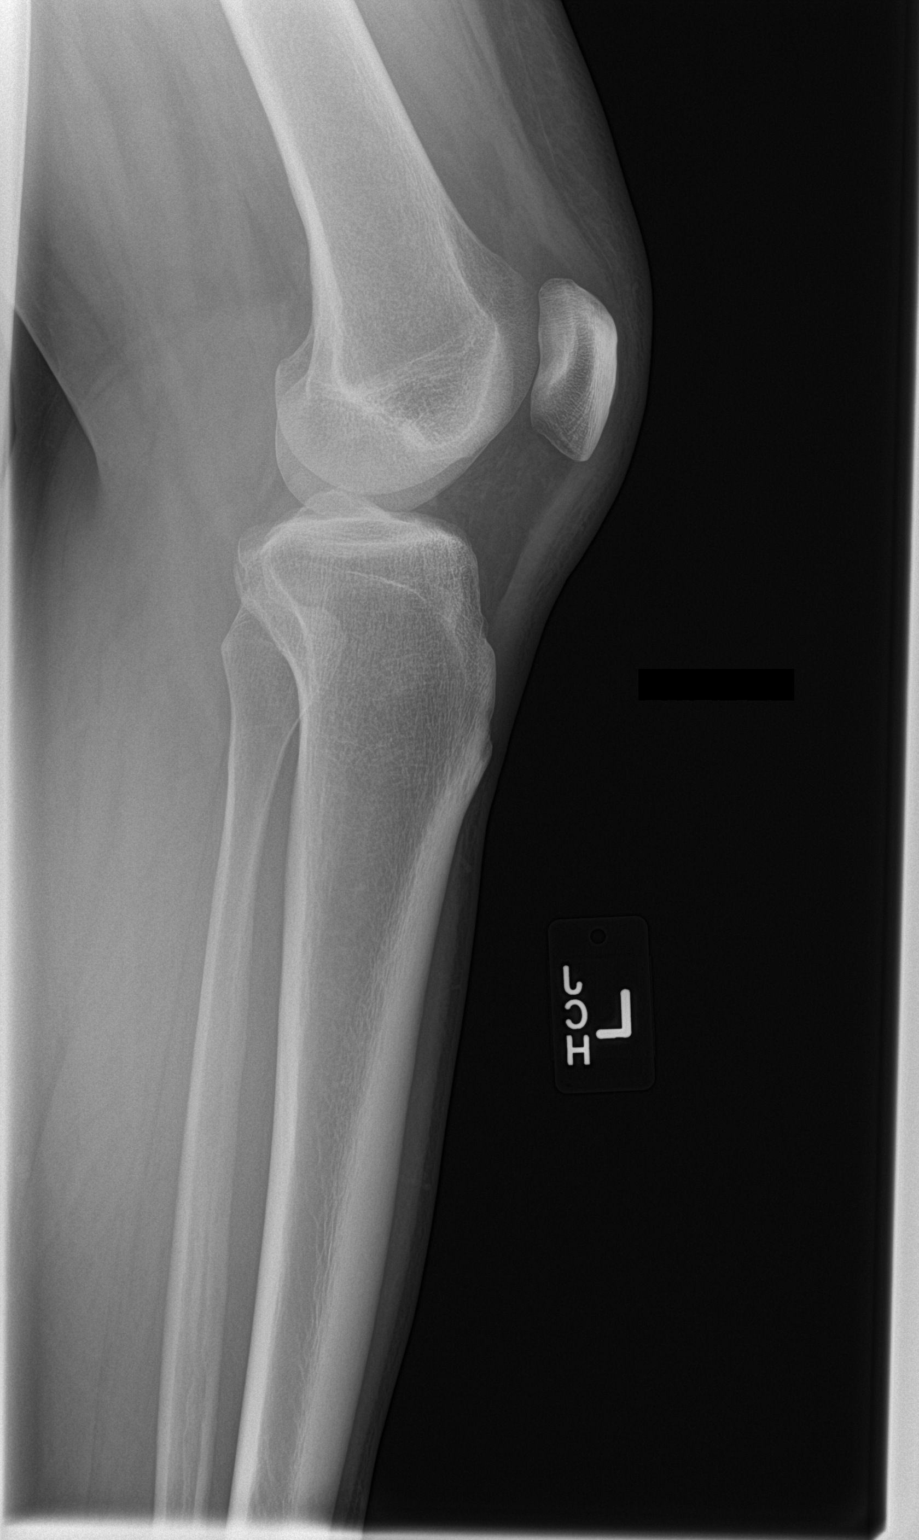

[4 of 4 positions shown; findings below may reference images not displayed]

FINDINGS: There is an oblique fracture through the distal left fibular
metaphysis with adjacent soft tissue swelling. Additionally the
talus is laterally displaced with respect to the distal tibia
consistent with a fracture dislocation. A small fracture fragment is
noted medially consistent with an avulsion from the medial
malleolus.
IMPRESSION: Fracture dislocation of the left ankle as described.
